# Patient Record
Sex: Female | Born: 1960
Health system: Southern US, Community
[De-identification: ages and names within clinical notes are randomized; demographics above are authoritative.]

## PROBLEM LIST (undated history)

## (undated) DIAGNOSIS — F410 Panic disorder [episodic paroxysmal anxiety] without agoraphobia: Secondary | ICD-10-CM

## (undated) DIAGNOSIS — M810 Age-related osteoporosis without current pathological fracture: Secondary | ICD-10-CM

## (undated) DIAGNOSIS — K219 Gastro-esophageal reflux disease without esophagitis: Secondary | ICD-10-CM

## (undated) DIAGNOSIS — C801 Malignant (primary) neoplasm, unspecified: Secondary | ICD-10-CM

## (undated) DIAGNOSIS — L259 Unspecified contact dermatitis, unspecified cause: Secondary | ICD-10-CM

## (undated) DIAGNOSIS — F4310 Post-traumatic stress disorder, unspecified: Secondary | ICD-10-CM

## (undated) DIAGNOSIS — F32A Depression, unspecified: Secondary | ICD-10-CM

## (undated) DIAGNOSIS — R7303 Prediabetes: Secondary | ICD-10-CM

## (undated) DIAGNOSIS — F329 Major depressive disorder, single episode, unspecified: Secondary | ICD-10-CM

## (undated) HISTORY — PX: TUBAL LIGATION: SHX77

## (undated) HISTORY — PX: OTHER SURGICAL HISTORY: SHX169

## (undated) HISTORY — DX: Gastro-esophageal reflux disease without esophagitis: K21.9

## (undated) HISTORY — PX: AUGMENTATION MAMMAPLASTY: SUR837

## (undated) HISTORY — PX: COLONOSCOPY: SHX174

## (undated) HISTORY — PX: ANKLE FRACTURE SURGERY: SHX122

## (undated) HISTORY — DX: Age-related osteoporosis without current pathological fracture: M81.0

## (undated) HISTORY — PX: BREAST SURGERY: SHX581

---

## 1994-08-30 HISTORY — PX: INCISION AND DRAINAGE OF WOUND: SHX1803

## 1999-01-21 ENCOUNTER — Encounter: Payer: Self-pay | Admitting: Emergency Medicine

## 1999-01-21 ENCOUNTER — Emergency Department (HOSPITAL_COMMUNITY): Admission: EM | Admit: 1999-01-21 | Discharge: 1999-01-21 | Payer: Self-pay | Admitting: Emergency Medicine

## 2006-08-30 DIAGNOSIS — C801 Malignant (primary) neoplasm, unspecified: Secondary | ICD-10-CM

## 2006-08-30 HISTORY — DX: Malignant (primary) neoplasm, unspecified: C80.1

## 2012-08-30 HISTORY — PX: OTHER SURGICAL HISTORY: SHX169

## 2015-04-17 DIAGNOSIS — S2242XA Multiple fractures of ribs, left side, initial encounter for closed fracture: Secondary | ICD-10-CM | POA: Diagnosis not present

## 2015-04-17 DIAGNOSIS — S2232XA Fracture of one rib, left side, initial encounter for closed fracture: Secondary | ICD-10-CM | POA: Diagnosis not present

## 2015-04-17 DIAGNOSIS — S2239XA Fracture of one rib, unspecified side, initial encounter for closed fracture: Secondary | ICD-10-CM | POA: Diagnosis not present

## 2015-04-17 DIAGNOSIS — F17200 Nicotine dependence, unspecified, uncomplicated: Secondary | ICD-10-CM | POA: Diagnosis not present

## 2015-04-20 DIAGNOSIS — T65894A Toxic effect of other specified substances, undetermined, initial encounter: Secondary | ICD-10-CM | POA: Diagnosis not present

## 2015-04-20 DIAGNOSIS — H10213 Acute toxic conjunctivitis, bilateral: Secondary | ICD-10-CM | POA: Diagnosis not present

## 2015-04-20 DIAGNOSIS — F419 Anxiety disorder, unspecified: Secondary | ICD-10-CM | POA: Diagnosis not present

## 2015-05-08 DIAGNOSIS — D225 Melanocytic nevi of trunk: Secondary | ICD-10-CM | POA: Diagnosis not present

## 2015-05-08 DIAGNOSIS — B079 Viral wart, unspecified: Secondary | ICD-10-CM | POA: Diagnosis not present

## 2015-05-08 DIAGNOSIS — Z7189 Other specified counseling: Secondary | ICD-10-CM | POA: Diagnosis not present

## 2015-05-08 DIAGNOSIS — C44511 Basal cell carcinoma of skin of breast: Secondary | ICD-10-CM | POA: Diagnosis not present

## 2015-05-08 DIAGNOSIS — L814 Other melanin hyperpigmentation: Secondary | ICD-10-CM | POA: Diagnosis not present

## 2015-05-08 DIAGNOSIS — D485 Neoplasm of uncertain behavior of skin: Secondary | ICD-10-CM | POA: Diagnosis not present

## 2015-05-09 DIAGNOSIS — Z1231 Encounter for screening mammogram for malignant neoplasm of breast: Secondary | ICD-10-CM | POA: Diagnosis not present

## 2015-05-09 DIAGNOSIS — F339 Major depressive disorder, recurrent, unspecified: Secondary | ICD-10-CM | POA: Diagnosis not present

## 2015-05-09 DIAGNOSIS — R42 Dizziness and giddiness: Secondary | ICD-10-CM | POA: Diagnosis not present

## 2015-05-09 DIAGNOSIS — M4722 Other spondylosis with radiculopathy, cervical region: Secondary | ICD-10-CM | POA: Diagnosis not present

## 2015-05-09 DIAGNOSIS — K219 Gastro-esophageal reflux disease without esophagitis: Secondary | ICD-10-CM | POA: Diagnosis not present

## 2015-05-09 DIAGNOSIS — Z13820 Encounter for screening for osteoporosis: Secondary | ICD-10-CM | POA: Diagnosis not present

## 2015-05-09 DIAGNOSIS — E780 Pure hypercholesterolemia: Secondary | ICD-10-CM | POA: Diagnosis not present

## 2015-05-09 DIAGNOSIS — F17210 Nicotine dependence, cigarettes, uncomplicated: Secondary | ICD-10-CM | POA: Diagnosis not present

## 2015-05-09 DIAGNOSIS — Z6825 Body mass index (BMI) 25.0-25.9, adult: Secondary | ICD-10-CM | POA: Diagnosis not present

## 2015-05-09 DIAGNOSIS — F411 Generalized anxiety disorder: Secondary | ICD-10-CM | POA: Diagnosis not present

## 2015-05-09 DIAGNOSIS — S2242XD Multiple fractures of ribs, left side, subsequent encounter for fracture with routine healing: Secondary | ICD-10-CM | POA: Diagnosis not present

## 2015-05-12 DIAGNOSIS — R350 Frequency of micturition: Secondary | ICD-10-CM | POA: Diagnosis not present

## 2015-05-12 DIAGNOSIS — K219 Gastro-esophageal reflux disease without esophagitis: Secondary | ICD-10-CM | POA: Diagnosis not present

## 2015-05-12 DIAGNOSIS — E780 Pure hypercholesterolemia: Secondary | ICD-10-CM | POA: Diagnosis not present

## 2015-05-12 DIAGNOSIS — E559 Vitamin D deficiency, unspecified: Secondary | ICD-10-CM | POA: Diagnosis not present

## 2015-05-13 DIAGNOSIS — M4722 Other spondylosis with radiculopathy, cervical region: Secondary | ICD-10-CM | POA: Diagnosis not present

## 2015-05-13 DIAGNOSIS — M542 Cervicalgia: Secondary | ICD-10-CM | POA: Diagnosis not present

## 2015-05-19 DIAGNOSIS — M8589 Other specified disorders of bone density and structure, multiple sites: Secondary | ICD-10-CM | POA: Diagnosis not present

## 2015-05-19 DIAGNOSIS — Z13820 Encounter for screening for osteoporosis: Secondary | ICD-10-CM | POA: Diagnosis not present

## 2015-05-26 DIAGNOSIS — C44511 Basal cell carcinoma of skin of breast: Secondary | ICD-10-CM | POA: Diagnosis not present

## 2015-08-19 DIAGNOSIS — M5489 Other dorsalgia: Secondary | ICD-10-CM | POA: Diagnosis not present

## 2015-08-19 DIAGNOSIS — Z6826 Body mass index (BMI) 26.0-26.9, adult: Secondary | ICD-10-CM | POA: Diagnosis not present

## 2015-08-19 DIAGNOSIS — G4700 Insomnia, unspecified: Secondary | ICD-10-CM | POA: Diagnosis not present

## 2015-08-19 DIAGNOSIS — K219 Gastro-esophageal reflux disease without esophagitis: Secondary | ICD-10-CM | POA: Diagnosis not present

## 2015-08-19 DIAGNOSIS — E559 Vitamin D deficiency, unspecified: Secondary | ICD-10-CM | POA: Diagnosis not present

## 2015-08-19 DIAGNOSIS — E7800 Pure hypercholesterolemia, unspecified: Secondary | ICD-10-CM | POA: Diagnosis not present

## 2015-08-19 DIAGNOSIS — F17210 Nicotine dependence, cigarettes, uncomplicated: Secondary | ICD-10-CM | POA: Diagnosis not present

## 2015-08-19 DIAGNOSIS — F411 Generalized anxiety disorder: Secondary | ICD-10-CM | POA: Diagnosis not present

## 2015-08-19 DIAGNOSIS — M25551 Pain in right hip: Secondary | ICD-10-CM | POA: Diagnosis not present

## 2015-08-19 DIAGNOSIS — F339 Major depressive disorder, recurrent, unspecified: Secondary | ICD-10-CM | POA: Diagnosis not present

## 2015-08-19 DIAGNOSIS — M859 Disorder of bone density and structure, unspecified: Secondary | ICD-10-CM | POA: Diagnosis not present

## 2015-11-19 DIAGNOSIS — R7301 Impaired fasting glucose: Secondary | ICD-10-CM | POA: Diagnosis not present

## 2015-11-19 DIAGNOSIS — F339 Major depressive disorder, recurrent, unspecified: Secondary | ICD-10-CM | POA: Diagnosis not present

## 2015-11-19 DIAGNOSIS — E559 Vitamin D deficiency, unspecified: Secondary | ICD-10-CM | POA: Diagnosis not present

## 2015-11-19 DIAGNOSIS — K219 Gastro-esophageal reflux disease without esophagitis: Secondary | ICD-10-CM | POA: Diagnosis not present

## 2015-11-19 DIAGNOSIS — Z0000 Encounter for general adult medical examination without abnormal findings: Secondary | ICD-10-CM | POA: Diagnosis not present

## 2015-11-19 DIAGNOSIS — F411 Generalized anxiety disorder: Secondary | ICD-10-CM | POA: Diagnosis not present

## 2015-11-19 DIAGNOSIS — G4700 Insomnia, unspecified: Secondary | ICD-10-CM | POA: Diagnosis not present

## 2015-11-19 DIAGNOSIS — F17210 Nicotine dependence, cigarettes, uncomplicated: Secondary | ICD-10-CM | POA: Diagnosis not present

## 2015-11-19 DIAGNOSIS — M859 Disorder of bone density and structure, unspecified: Secondary | ICD-10-CM | POA: Diagnosis not present

## 2016-04-20 DIAGNOSIS — F17200 Nicotine dependence, unspecified, uncomplicated: Secondary | ICD-10-CM | POA: Diagnosis not present

## 2016-04-20 DIAGNOSIS — M5116 Intervertebral disc disorders with radiculopathy, lumbar region: Secondary | ICD-10-CM | POA: Diagnosis not present

## 2016-04-20 DIAGNOSIS — M545 Low back pain: Secondary | ICD-10-CM | POA: Diagnosis not present

## 2016-04-20 DIAGNOSIS — Z6825 Body mass index (BMI) 25.0-25.9, adult: Secondary | ICD-10-CM | POA: Diagnosis not present

## 2016-04-20 DIAGNOSIS — Z79899 Other long term (current) drug therapy: Secondary | ICD-10-CM | POA: Diagnosis not present

## 2016-04-20 DIAGNOSIS — Z885 Allergy status to narcotic agent status: Secondary | ICD-10-CM | POA: Diagnosis not present

## 2016-04-20 DIAGNOSIS — Z880 Allergy status to penicillin: Secondary | ICD-10-CM | POA: Diagnosis not present

## 2016-04-20 DIAGNOSIS — M25551 Pain in right hip: Secondary | ICD-10-CM | POA: Diagnosis not present

## 2016-05-04 DIAGNOSIS — E559 Vitamin D deficiency, unspecified: Secondary | ICD-10-CM | POA: Diagnosis not present

## 2016-05-04 DIAGNOSIS — F17210 Nicotine dependence, cigarettes, uncomplicated: Secondary | ICD-10-CM | POA: Diagnosis not present

## 2016-05-04 DIAGNOSIS — R7301 Impaired fasting glucose: Secondary | ICD-10-CM | POA: Diagnosis not present

## 2016-05-04 DIAGNOSIS — Z6826 Body mass index (BMI) 26.0-26.9, adult: Secondary | ICD-10-CM | POA: Diagnosis not present

## 2016-05-04 DIAGNOSIS — E784 Other hyperlipidemia: Secondary | ICD-10-CM | POA: Diagnosis not present

## 2016-05-04 DIAGNOSIS — M5489 Other dorsalgia: Secondary | ICD-10-CM | POA: Diagnosis not present

## 2016-05-04 DIAGNOSIS — K219 Gastro-esophageal reflux disease without esophagitis: Secondary | ICD-10-CM | POA: Diagnosis not present

## 2016-05-04 DIAGNOSIS — F411 Generalized anxiety disorder: Secondary | ICD-10-CM | POA: Diagnosis not present

## 2016-05-04 DIAGNOSIS — M5136 Other intervertebral disc degeneration, lumbar region: Secondary | ICD-10-CM | POA: Diagnosis not present

## 2016-06-23 DIAGNOSIS — M5441 Lumbago with sciatica, right side: Secondary | ICD-10-CM | POA: Diagnosis not present

## 2016-07-14 DIAGNOSIS — K219 Gastro-esophageal reflux disease without esophagitis: Secondary | ICD-10-CM | POA: Diagnosis not present

## 2016-07-14 DIAGNOSIS — F419 Anxiety disorder, unspecified: Secondary | ICD-10-CM | POA: Diagnosis not present

## 2016-07-14 DIAGNOSIS — F4310 Post-traumatic stress disorder, unspecified: Secondary | ICD-10-CM | POA: Diagnosis not present

## 2016-08-31 ENCOUNTER — Encounter (HOSPITAL_COMMUNITY): Payer: Self-pay | Admitting: Emergency Medicine

## 2016-08-31 ENCOUNTER — Emergency Department (HOSPITAL_COMMUNITY)
Admission: EM | Admit: 2016-08-31 | Discharge: 2016-09-01 | Disposition: A | Discharge status: Other Acute Inpatient Hospital | Payer: Medicare Other | Attending: Emergency Medicine | Admitting: Emergency Medicine

## 2016-08-31 DIAGNOSIS — Z79899 Other long term (current) drug therapy: Secondary | ICD-10-CM | POA: Insufficient documentation

## 2016-08-31 DIAGNOSIS — Z853 Personal history of malignant neoplasm of breast: Secondary | ICD-10-CM | POA: Diagnosis not present

## 2016-08-31 DIAGNOSIS — T424X2A Poisoning by benzodiazepines, intentional self-harm, initial encounter: Secondary | ICD-10-CM | POA: Insufficient documentation

## 2016-08-31 DIAGNOSIS — F17210 Nicotine dependence, cigarettes, uncomplicated: Secondary | ICD-10-CM | POA: Insufficient documentation

## 2016-08-31 DIAGNOSIS — F332 Major depressive disorder, recurrent severe without psychotic features: Secondary | ICD-10-CM | POA: Insufficient documentation

## 2016-08-31 DIAGNOSIS — T50904A Poisoning by unspecified drugs, medicaments and biological substances, undetermined, initial encounter: Secondary | ICD-10-CM | POA: Diagnosis not present

## 2016-08-31 HISTORY — DX: Malignant (primary) neoplasm, unspecified: C80.1

## 2016-08-31 LAB — COMPREHENSIVE METABOLIC PANEL
ALBUMIN: 4.6 g/dL (ref 3.5–5.0)
ALT: 19 U/L (ref 14–54)
AST: 23 U/L (ref 15–41)
Alkaline Phosphatase: 63 U/L (ref 38–126)
Anion gap: 8 (ref 5–15)
BUN: 11 mg/dL (ref 6–20)
CHLORIDE: 108 mmol/L (ref 101–111)
CO2: 23 mmol/L (ref 22–32)
CREATININE: 1.06 mg/dL — AB (ref 0.44–1.00)
Calcium: 9.4 mg/dL (ref 8.9–10.3)
GFR calc Af Amer: >60 mL/min (ref >60)
GFR calc non Af Amer: 58 mL/min — ABNORMAL LOW (ref >60)
Glucose, Bld: 93 mg/dL (ref 65–99)
Potassium: 3.7 mmol/L (ref 3.5–5.1)
SODIUM: 139 mmol/L (ref 135–145)
Total Bilirubin: 0.9 mg/dL (ref 0.3–1.2)
Total Protein: 7.2 g/dL (ref 6.5–8.1)

## 2016-08-31 LAB — CBC
HCT: 44.7 % (ref 36.0–46.0)
HEMOGLOBIN: 15 g/dL (ref 12.0–15.0)
MCH: 32.3 pg (ref 26.0–34.0)
MCHC: 33.6 g/dL (ref 30.0–36.0)
MCV: 96.3 fL (ref 78.0–100.0)
Platelets: 166 10*3/uL (ref 150–400)
RBC: 4.64 MIL/uL (ref 3.87–5.11)
RDW: 12.9 % (ref 11.5–15.5)
WBC: 4.6 10*3/uL (ref 4.0–10.5)

## 2016-08-31 LAB — ETHANOL: Alcohol, Ethyl (B): 129 mg/dL — ABNORMAL HIGH (ref <5)

## 2016-08-31 LAB — RAPID URINE DRUG SCREEN, HOSP PERFORMED
AMPHETAMINES: NOT DETECTED
BENZODIAZEPINES: POSITIVE — AB
Barbiturates: NOT DETECTED
Cocaine: NOT DETECTED
OPIATES: NOT DETECTED
TETRAHYDROCANNABINOL: NOT DETECTED

## 2016-08-31 LAB — ACETAMINOPHEN LEVEL: Acetaminophen (Tylenol), Serum: <10 ug/mL — ABNORMAL LOW (ref 10–30)

## 2016-08-31 LAB — CBG MONITORING, ED: GLUCOSE-CAPILLARY: 91 mg/dL (ref 65–99)

## 2016-08-31 LAB — SALICYLATE LEVEL: Salicylate Lvl: <7 mg/dL (ref 2.8–30.0)

## 2016-08-31 NOTE — ED Notes (Signed)
Spoke with Blanch Media at [employer]: -CNS and respiratory depression -Supportive care -Observe until midnight and then can be cleared

## 2016-08-31 NOTE — ED Notes (Signed)
Bed: RESA Expected date:  Expected time:  Means of arrival:  Comments: EMS 56 yo female overdosed on 32 xanax and intoxicated
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
## 2016-08-31 NOTE — ED Triage Notes (Signed)
Patient arrives by EMS with complaints of overdose. Per EMS, patient states she is depressed and took 50 of Xanax 0.5 mg at 1900 with 4 beers. CBG 124, #20 angio left hand. VSS 117/78 HR 84 RR18 and 97%RA

## 2016-09-01 ENCOUNTER — Inpatient Hospital Stay (HOSPITAL_COMMUNITY)
Admission: AD | Admit: 2016-09-01 | Discharge: 2016-09-04 | DRG: 885 | Disposition: A | Discharge status: Home / Self Care | Payer: Medicare Other | Source: Intra-hospital | Attending: Psychiatry | Admitting: Psychiatry

## 2016-09-01 ENCOUNTER — Encounter (HOSPITAL_COMMUNITY): Payer: Self-pay | Admitting: *Deleted

## 2016-09-01 DIAGNOSIS — Z789 Other specified health status: Secondary | ICD-10-CM | POA: Diagnosis not present

## 2016-09-01 DIAGNOSIS — Z803 Family history of malignant neoplasm of breast: Secondary | ICD-10-CM | POA: Diagnosis not present

## 2016-09-01 DIAGNOSIS — Z85038 Personal history of other malignant neoplasm of large intestine: Secondary | ICD-10-CM

## 2016-09-01 DIAGNOSIS — F411 Generalized anxiety disorder: Secondary | ICD-10-CM | POA: Diagnosis present

## 2016-09-01 DIAGNOSIS — F332 Major depressive disorder, recurrent severe without psychotic features: Secondary | ICD-10-CM | POA: Diagnosis not present

## 2016-09-01 DIAGNOSIS — G4700 Insomnia, unspecified: Secondary | ICD-10-CM | POA: Diagnosis present

## 2016-09-01 DIAGNOSIS — R45851 Suicidal ideations: Secondary | ICD-10-CM | POA: Diagnosis present

## 2016-09-01 DIAGNOSIS — Z853 Personal history of malignant neoplasm of breast: Secondary | ICD-10-CM

## 2016-09-01 DIAGNOSIS — T424X1A Poisoning by benzodiazepines, accidental (unintentional), initial encounter: Secondary | ICD-10-CM | POA: Diagnosis present

## 2016-09-01 DIAGNOSIS — F410 Panic disorder [episodic paroxysmal anxiety] without agoraphobia: Secondary | ICD-10-CM | POA: Diagnosis present

## 2016-09-01 DIAGNOSIS — F17210 Nicotine dependence, cigarettes, uncomplicated: Secondary | ICD-10-CM | POA: Diagnosis present

## 2016-09-01 DIAGNOSIS — T424X2A Poisoning by benzodiazepines, intentional self-harm, initial encounter: Secondary | ICD-10-CM | POA: Diagnosis not present

## 2016-09-01 DIAGNOSIS — F1090 Alcohol use, unspecified, uncomplicated: Secondary | ICD-10-CM

## 2016-09-01 DIAGNOSIS — F4310 Post-traumatic stress disorder, unspecified: Secondary | ICD-10-CM | POA: Diagnosis present

## 2016-09-01 DIAGNOSIS — Z7289 Other problems related to lifestyle: Secondary | ICD-10-CM

## 2016-09-01 DIAGNOSIS — F322 Major depressive disorder, single episode, severe without psychotic features: Secondary | ICD-10-CM | POA: Diagnosis not present

## 2016-09-01 DIAGNOSIS — T424X4A Poisoning by benzodiazepines, undetermined, initial encounter: Secondary | ICD-10-CM | POA: Diagnosis not present

## 2016-09-01 DIAGNOSIS — Z62810 Personal history of physical and sexual abuse in childhood: Secondary | ICD-10-CM | POA: Diagnosis present

## 2016-09-01 DIAGNOSIS — Z800 Family history of malignant neoplasm of digestive organs: Secondary | ICD-10-CM | POA: Diagnosis not present

## 2016-09-01 DIAGNOSIS — Z79899 Other long term (current) drug therapy: Secondary | ICD-10-CM | POA: Diagnosis not present

## 2016-09-01 MED ORDER — MAGNESIUM HYDROXIDE 400 MG/5ML PO SUSP: 30.0000 mL | Freq: Every day | ORAL | Status: DC | PRN

## 2016-09-01 MED ORDER — HYDROXYZINE HCL 25 MG PO TABS
25.0000 mg | ORAL_TABLET | Freq: Three times a day (TID) | ORAL | Status: DC | PRN
  Filled 2016-09-01: qty 10

## 2016-09-01 MED ORDER — PANTOPRAZOLE SODIUM 40 MG PO TBEC
40.0000 mg | DELAYED_RELEASE_TABLET | Freq: Every day | ORAL | Status: DC
  Administered 2016-09-02 – 2016-09-04 (×3): 40 mg via ORAL
  Filled 2016-09-01 (×5): qty 1

## 2016-09-01 MED ORDER — HYDROXYZINE HCL 25 MG PO TABS
25.0000 mg | ORAL_TABLET | Freq: Three times a day (TID) | ORAL | Status: DC | PRN
Start: 2016-09-01 — End: 2016-09-01

## 2016-09-01 MED ORDER — FLUOXETINE HCL 10 MG PO CAPS
10.0000 mg | ORAL_CAPSULE | Freq: Every day | ORAL | Status: DC
Start: 2016-09-01 — End: 2016-09-01
  Filled 2016-09-01: qty 1

## 2016-09-01 MED ORDER — FLUOXETINE HCL 10 MG PO CAPS
10.0000 mg | ORAL_CAPSULE | Freq: Every day | ORAL | Status: DC
Start: 2016-09-02 — End: 2016-09-02
  Filled 2016-09-01 (×2): qty 1

## 2016-09-01 MED ORDER — NICOTINE 21 MG/24HR TD PT24
21.0000 mg | MEDICATED_PATCH | Freq: Every day | TRANSDERMAL | Status: DC
  Administered 2016-09-01 – 2016-09-04 (×4): 21 mg via TRANSDERMAL
  Filled 2016-09-01 (×6): qty 1

## 2016-09-01 MED ORDER — OMEPRAZOLE MAGNESIUM 20 MG PO TBEC
20.0000 mg | DELAYED_RELEASE_TABLET | Freq: Every day | ORAL | Status: DC
  Filled 2016-09-01: qty 1

## 2016-09-01 MED ORDER — TRAZODONE HCL 100 MG PO TABS: 100.0000 mg | ORAL_TABLET | Freq: Every evening | ORAL | Status: DC | PRN

## 2016-09-01 MED ORDER — OMEPRAZOLE 20 MG PO CPDR
20.0000 mg | DELAYED_RELEASE_CAPSULE | Freq: Every day | ORAL | Status: DC
  Administered 2016-09-01: 20 mg via ORAL
  Filled 2016-09-01: qty 1

## 2016-09-01 MED ORDER — ALUM & MAG HYDROXIDE-SIMETH 200-200-20 MG/5ML PO SUSP
30.0000 mL | ORAL | Status: DC | PRN
  Administered 2016-09-02: 30 mL via ORAL
  Filled 2016-09-01: qty 30

## 2016-09-01 NOTE — ED Notes (Signed)
Blanch Media from [place] control called for update on pt. Pt sleeping at this time.

## 2016-09-01 NOTE — ED Notes (Signed)
Patient placed in scrubs-patient and belongings wanded prior to transfer to [facility]

## 2016-09-01 NOTE — Progress Notes (Addendum)
Adult Psychoeducational Group Note  Date:  09/01/2016 Time:  11:50 PM  Group Topic/Focus:  Wrap-Up Group:   The focus of this group is to help patients review their daily goal of treatment and discuss progress on daily workbooks.   Participation Level:  Active  Participation Quality:  Appropriate  Affect:  Appropriate  Cognitive:  Appropriate  Insight: Appropriate  Engagement in Group:  Engaged  Modes of Intervention:  Discussion  Additional Comments: Patient attended wrap-up group and said that her day started up with a 2 but later changed to a 10.  She contributed that to her peers who had her feel welcome.   Orma Cheetham W Masiyah Jorstad [id number], 11:50 PM
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
## 2016-09-01 NOTE — ED Notes (Signed)
Patient states she is feeling weak and unsteady because she hasn't eaten. Patient offered a variety of food, everything available in department. Pt refuses food, states she can't eat now. Only thing she will accept is juice.

## 2016-09-01 NOTE — Progress Notes (Signed)
09/01/16 1403:  LRT went to pt room to offer activities, pt was sleep.  Victorino Sparrow, LRT/CTRS

## 2016-09-01 NOTE — ED Notes (Signed)
[facility] transport on unit to transfer pt to [facility] Adult unit per MD order. Pt signed for personal property/medication and property given to [facility] transport for transfer. Pt signed e-signature, Pt ambulatory off unit with [facility] transport.
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
## 2016-09-01 NOTE — Progress Notes (Signed)
Patient ID: Kayla Chang, female   DOB: 10-10-1960, 56 y.o.   MRN: [id number] PER STATE REGULATIONS 482.30  THIS CHART WAS REVIEWED FOR MEDICAL NECESSITY WITH RESPECT TO THE PATIENT'S ADMISSION/DURATION OF STAY.  NEXT REVIEW DATE:09/05/16  Roma Schanz, RN, BSN CASE MANAGER

## 2016-09-01 NOTE — Tx Team (Signed)
Initial Treatment Plan 09/01/2016 3:59 PM Kayla Chang [id number]    PATIENT STRESSORS: Financial difficulties Marital or family conflict   PATIENT STRENGTHS: Ability for insight Communication skills Motivation for treatment/growth Physical Health   PATIENT IDENTIFIED PROBLEMS: At risk for suicide  Depression  Anxiety  "Self-Esteem"  "Self- sufficient"             DISCHARGE CRITERIA:  Ability to meet basic life and health needs Improved stabilization in mood, thinking, and/or behavior Medical problems require only outpatient monitoring Need for constant or close observation no longer present Safe-care adequate arrangements made  PRELIMINARY DISCHARGE PLAN: Outpatient therapy Return to previous living arrangement  PATIENT/FAMILY INVOLVEMENT: This treatment plan has been presented to and reviewed with the patient, Kayla Chang.  The patient and family have been given the opportunity to ask questions and make suggestions.  Dustin Flock, RN 09/01/2016, 3:59 PM

## 2016-09-01 NOTE — ED Notes (Signed)
Pt visitor at bedside.

## 2016-09-01 NOTE — BH Assessment (Signed)
[place] Assessment Progress Note   Clinician spoke with Terri, Therapist, sports.  Patient is sleeping currently but will try assessment in about 10 min.

## 2016-09-01 NOTE — BH Assessment (Signed)
[place] Assessment Progress Note  Per Corena Pilgrim, MD, this pt requires psychiatric hospitalization at this time.  Letitia Libra, RN, [facility] has assigned pt to [facility] Rm [id number]; they will be ready to receive pt at 14:00.  Pt has signed Voluntary Admission and Consent for Treatment, as well as Consent to Release Information to her son and her daughter-in-law, and signed forms have been faxed to [facility]  Pt's nurse, Caryl Pina, has been notified, and agrees to send original paperwork along with pt via [facility], and to call report to [phone].  Jalene Mullet, [place] Triage Specialist [phone]

## 2016-09-01 NOTE — ED Notes (Signed)
TTS still in progress

## 2016-09-02 DIAGNOSIS — Z803 Family history of malignant neoplasm of breast: Secondary | ICD-10-CM

## 2016-09-02 DIAGNOSIS — T424X4A Poisoning by benzodiazepines, undetermined, initial encounter: Secondary | ICD-10-CM

## 2016-09-02 DIAGNOSIS — F332 Major depressive disorder, recurrent severe without psychotic features: Principal | ICD-10-CM

## 2016-09-02 DIAGNOSIS — T424X1A Poisoning by benzodiazepines, accidental (unintentional), initial encounter: Secondary | ICD-10-CM | POA: Diagnosis present

## 2016-09-02 DIAGNOSIS — Z800 Family history of malignant neoplasm of digestive organs: Secondary | ICD-10-CM

## 2016-09-02 DIAGNOSIS — Z888 Allergy status to other drugs, medicaments and biological substances status: Secondary | ICD-10-CM

## 2016-09-02 MED ORDER — IBUPROFEN 600 MG PO TABS
600.0000 mg | ORAL_TABLET | Freq: Four times a day (QID) | ORAL | Status: DC | PRN
Start: 2016-09-02 — End: 2016-09-04
  Administered 2016-09-02: 600 mg via ORAL
  Filled 2016-09-02: qty 1

## 2016-09-02 MED ORDER — TRAZODONE HCL 50 MG PO TABS
50.0000 mg | ORAL_TABLET | Freq: Every evening | ORAL | Status: DC | PRN
  Filled 2016-09-02: qty 10

## 2016-09-02 MED ORDER — LORAZEPAM 1 MG PO TABS: 1.0000 mg | ORAL_TABLET | Freq: Four times a day (QID) | ORAL | Status: DC | PRN

## 2016-09-02 MED ORDER — BUSPIRONE HCL 5 MG PO TABS
5.0000 mg | ORAL_TABLET | Freq: Two times a day (BID) | ORAL | Status: DC
Start: 2016-09-02 — End: 2016-09-04
  Administered 2016-09-02 – 2016-09-04 (×4): 5 mg via ORAL
  Filled 2016-09-02 (×9): qty 1

## 2016-09-02 NOTE — BHH Group Notes (Signed)
[facility] Group Therapy 09/02/2016 1:15pm  Type of Therapy: Mental Health Association Presentation  Participation Level: Active  Participation Quality: Attentive  Affect: Appropriate  Cognitive: Oriented  Insight: Developing/Improving  Engagement in Therapy: Engaged  Modes of Intervention: Discussion, Education and Socialization  Summary of Progress/Problems: Mental Health Association ([place]) Speaker came to talk about his personal journey with substance abuse and addiction. The pt processed ways by which to relate to the speaker. [place] speaker provided handouts and educational information pertaining to groups and services offered by the [facility]. Pt was engaged in speaker's presentation and was receptive to resources provided.    Adriana Reams, LCSW 09/02/2016 1:12 PM

## 2016-09-02 NOTE — BHH Suicide Risk Assessment (Signed)
[place] INPATIENT:  Family/Significant Other Suicide Prevention Education  Suicide Prevention Education:  Education Completed; Sharmaine Base, Pt's son [phone], has been identified by the patient as the family member/significant other with whom the patient will be residing, and identified as the person(s) who will aid the patient in the event of a mental health crisis (suicidal ideations/suicide attempt).  With written consent from the patient, the family member/significant other has been provided the following suicide prevention education, prior to the and/or following the discharge of the patient.  The suicide prevention education provided includes the following:  Suicide risk factors  Suicide prevention and interventions  National Suicide Hotline telephone number  [facility] assessment telephone number  [facility] Emergency Assistance [place] and/or Residential Mobile Crisis Unit telephone number  Request made of family/significant other to:  Remove weapons (e.g., guns, rifles, knives), all items previously/currently identified as safety concern.    Remove drugs/medications (over-the-counter, prescriptions, illicit drugs), all items previously/currently identified as a safety concern.  The family member/significant other verbalizes understanding of the suicide prevention education information provided.  The family member/significant other agrees to remove the items of safety concern listed above.  Gladstone Lighter 09/02/2016, 12:48 PM
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
## 2016-09-02 NOTE — Progress Notes (Signed)
DAR NOTE: Patient presents with anxious affect and depressed mood.  Denies pain, auditory and visual hallucinations.  Rates depression at 0, hopelessness at 0, and anxiety at 1.  Maintained on routine safety checks.  Medications given as prescribed.  Support and encouragement offered as needed.  Attended group and participated.  States goal for today is " keep a clear mind."  Patient observed socializing with peers in the dayroom.  Offered no complaint.
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
## 2016-09-02 NOTE — BHH Group Notes (Signed)
[facility] Group Notes:  (Nursing/MHT/Case Management/Adjunct)  Date:  09/02/2016  Time:  12:44 PM  Type of Therapy:  Nurse Education  Participation Level:  Minimal  Participation Quality:  Appropriate  Affect:  Appropriate  Cognitive:  Alert  Insight:  Improving  Engagement in Group:  Improving  Modes of Intervention:  Discussion, Education and Support  Summary of Progress/Problems: Offered little but did respond appropriately when asked directly  Davina Poke 09/02/2016, 12:44 PM
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
## 2016-09-02 NOTE — Progress Notes (Signed)
D: Pt was in the hallway upon initial approach.  Pt presents with anxious, depressed affect and mood.  Pt just arrived today and reports she is still adjusting to the milieu.  She reports having a good visit with her son.  Pt denies SI/HI, denies hallucinations, denies pain.  Pt has been visible in milieu interacting with peers and staff appropriately.  Pt attended evening group.   A: Introduced self to pt.  Actively listened to pt and offered support and encouragement.  R: Pt is safe on the unit.  She reports she will inform staff of needs and concerns.  Pt verbally contracts for safety.  Will continue to monitor and assess.

## 2016-09-03 DIAGNOSIS — F17210 Nicotine dependence, cigarettes, uncomplicated: Secondary | ICD-10-CM

## 2016-09-03 DIAGNOSIS — T424X1A Poisoning by benzodiazepines, accidental (unintentional), initial encounter: Secondary | ICD-10-CM

## 2016-09-03 DIAGNOSIS — Z79899 Other long term (current) drug therapy: Secondary | ICD-10-CM

## 2016-09-03 NOTE — Progress Notes (Signed)
Recreation Therapy Notes  Date: 09/03/16 Time: 0930 Location: [address] Dayroom  Group Topic: Stress Management  Goal Area(s) Addresses:  Patient will verbalize importance of using healthy stress management.  Patient will identify positive emotions associated with healthy stress management.   Intervention:  Stress Management  Activity :  [facility].  LRT introduced the stress management technique of guided imagery to patients.  LRT read a script to guide patients through the technique so they could engage in the process.  Patients were to follow along as LRT read script.  Education:  Stress Management, Discharge Planning.   Education Outcome: Acknowledges edcuation/In group clarification offered/Needs additional education  Clinical Observations/Feedback:  Pt did not attend group.     Victorino Sparrow, LRT/CTRS         Victorino Sparrow A 09/03/2016 11:25 AM

## 2016-09-03 NOTE — [facility] Group Notes (Signed)
[place] LCSW Group Therapy 09/03/2016 1:15pm  Type of Therapy: Group Therapy- Feelings Around Relapse and Recovery  Participation Level: Active   Participation Quality:  Appropriate  Affect:  Appropriate  Cognitive: Alert and Oriented   Insight:  Developing   Engagement in Therapy: Developing/Improving and Engaged   Modes of Intervention: Clarification, Confrontation, Discussion, Education, Exploration, Limit-setting, Orientation, Problem-solving, Rapport Building, Art therapist, Socialization and Support  Summary of Progress/Problems: The topic for today was feelings about relapse. The group discussed what relapse prevention is to them and identified triggers that they are on the path to relapse. Members also processed their feeling towards relapse and were able to relate to common experiences. Group also discussed coping skills that can be used for relapse prevention.  Pt was active in group discussion. She interacted well with peers and provided feedback as well. Pt reports that she feels like a burden as her son is having to support her financially and emotionally. Pt expresses that this often makes her feel worthless because she is "the mother" and he is "the son."   Therapeutic Modalities:   Cognitive Behavioral Therapy Solution-Focused Therapy Assertiveness Training Relapse Prevention Therapy    Gaspar Cola [phone] 09/03/2016 4:05 PM

## 2016-09-03 NOTE — Progress Notes (Signed)
D: Patient seen on day room watching TV. No interaction. Denies pain, SI/HI, AH/VH at this time. Complained of heart burn - accepted Maalox with good effect. Rated 0/10 to depression and anxiety. No further compliant.   A:Staff offered support and encouragement as needed. Routine safety checks maintained for safety. Will continue to monitor patient.  R: Patient remains safe.

## 2016-09-03 NOTE — Progress Notes (Signed)
[facility] MD Progress Note  09/03/2016 3:52 PM Kayla Chang  MRN:  [id number] Subjective:  Patient states that she is hoping that Buspar will work. Objective:  Patient states that she took one too may of her Xanax to fall asleep.  She states that she had been under a lot of stress but does feel hopeful that she will attend outpatient rehab for peer support.  She is hopeful for discharge this weekend.  Not isolating.  Attending groups.  She is compliant on meds and there is no reports of ADR's.  Principal Problem: Overdose of benzodiazepine Diagnosis:   Patient Active Problem List   Diagnosis Date Noted  . Overdose of benzodiazepine [T42.4X1A] 09/02/2016  . Major depressive disorder, recurrent episode, severe ([place]) [F33.2] 09/01/2016  . Major depressive disorder, single episode, severe without psychotic features ([place]) [F32.2] 09/01/2016   Total Time spent with patient: 30 minutes  Past Psychiatric History: see HPI  Past Medical History:  Past Medical History:  Diagnosis Date  . Cancer [facility]) 2008   breast cancer, colon cancer    Past Surgical History:  Procedure Laterality Date  . BREAST SURGERY     Family History: History reviewed. No pertinent family history. Family Psychiatric  History: see HPI Social History:  History  Alcohol Use  . 2.4 oz/week  . 4 Cans of beer per week     History  Drug Use No    Social History   Social History  . Marital status: Legally Separated    Spouse name: N/A  . Number of children: N/A  . Years of education: N/A   Social History Main Topics  . Smoking status: Current Every Day Smoker    Packs/day: 1.00    Types: Cigarettes  . Smokeless tobacco: Never Used  . Alcohol use 2.4 oz/week    4 Cans of beer per week  . Drug use: No  . Sexual activity: Not Asked   Other Topics Concern  . None   Social History Narrative  . None   Additional Social History:                         Sleep: Good  Appetite:  Good  Current  Medications: Current Facility-Administered Medications  Medication Dose Route Frequency Provider Last Rate Last Dose  . alum & mag hydroxide-simeth (MAALOX/MYLANTA) 200-200-20 MG/5ML suspension 30 mL  30 mL Oral Q4H PRN Patrecia Pour, NP   30 mL at 09/02/16 2145  . busPIRone (BUSPAR) tablet 5 mg  5 mg Oral BID Jenne Campus, MD   5 mg at 09/03/16 0744  . hydrOXYzine (ATARAX/VISTARIL) tablet 25 mg  25 mg Oral TID PRN Patrecia Pour, NP      . ibuprofen (ADVIL,MOTRIN) tablet 600 mg  600 mg Oral Q6H PRN Benjamine Mola, FNP   600 mg at 09/02/16 1715  . LORazepam (ATIVAN) tablet 1 mg  1 mg Oral Q6H PRN Myer Peer Dareen Gutzwiller, MD      . magnesium hydroxide (MILK OF MAGNESIA) suspension 30 mL  30 mL Oral Daily PRN Patrecia Pour, NP      . nicotine (NICODERM CQ - dosed in mg/24 hours) patch 21 mg  21 mg Transdermal Daily Kerrie Buffalo, NP   21 mg at 09/03/16 0746  . pantoprazole (PROTONIX) EC tablet 40 mg  40 mg Oral Daily Patrecia Pour, NP   40 mg at 09/03/16 0744  . traZODone (DESYREL) tablet 50 mg  50 mg Oral QHS PRN Jenne Campus, MD        Lab Results: No results found for this or any previous visit (from the past 48 hour(s)).  Blood Alcohol level:  Lab Results  Component Value Date   ETH 129 (H) 99991111    Metabolic Disorder Labs: No results found for: HGBA1C, MPG No results found for: PROLACTIN No results found for: CHOL, TRIG, HDL, CHOLHDL, VLDL, LDLCALC  Physical Findings: AIMS: Facial and Oral Movements Muscles of Facial Expression: None, normal Lips and Perioral Area: None, normal Jaw: None, normal Tongue: None, normal,Extremity Movements Upper (arms, wrists, hands, fingers): None, normal Lower (legs, knees, ankles, toes): None, normal, Trunk Movements Neck, shoulders, hips: None, normal, Overall Severity Severity of abnormal movements (highest score from questions above): None, normal Incapacitation due to abnormal movements: None, normal Patient's awareness of  abnormal movements (rate only patient's report): No Awareness, Dental Status Current problems with teeth and/or dentures?: No Does patient usually wear dentures?: No  CIWA:    COWS:     Musculoskeletal: Strength & Muscle Tone: within normal limits Gait & Station: normal Patient leans: N/A  Psychiatric Specialty Exam: Physical Exam  Nursing note and vitals reviewed.   ROS  Blood pressure 117/86, pulse 81, temperature 98 F (36.7 C), temperature source Oral, resp. rate 18, height 5' 6.5" (1.689 m), weight 74.8 kg (165 lb).Body mass index is 26.23 kg/m.  General Appearance: Casual  Eye Contact:  Good  Speech:  Clear and Coherent  Volume:  Normal  Mood:  Anxious  Affect:  Appropriate  Thought Process:  Coherent  Orientation:  Full (Time, Place, and Person)  Thought Content:  Rumination  Suicidal Thoughts:  No  Homicidal Thoughts:  No  Memory:  Immediate;   Fair Recent;   Fair Remote;   Fair  Judgement:  Fair  Insight:  Fair  Psychomotor Activity:  Normal  Concentration:  Concentration: Good and Attention Span: Good  Recall:  Good  Fund of Knowledge:  Good  Language:  Good  Akathisia:  No  Handed:  Right  AIMS (if indicated):     Assets:  Desire for Improvement Resilience  ADL's:  Intact  Cognition:  WNL  Sleep:  Number of Hours: 5.75     Treatment Plan Summary: Review of chart, vital signs, medications, and notes.  1-Individual and group therapy  2-Medication management for depression and anxiety: Medications reviewed with the patient and she stated no untoward effects, unchanged.  3-Coping skills for depression, anxiety  4-Continue crisis stabilization and management  5-Address health issues--monitoring vital signs, stable  6-Treatment plan in progress to prevent relapse of depression and anxiety   Janett Labella, NP [facility] 09/03/2016, 3:52 PM   Agree with NP Progress Note

## 2016-09-03 NOTE — Progress Notes (Signed)
Adult Psychoeducational Group Note  Date:  09/03/2016 Time:  10:11 PM  Group Topic/Focus:  Wrap-Up Group:   The focus of this group is to help patients review their daily goal of treatment and discuss progress on daily workbooks.   Participation Level:  Active  Participation Quality:  Appropriate  Affect:  Appropriate  Cognitive:  Alert, Appropriate and Oriented  Insight: Appropriate  Engagement in Group:  Engaged  Modes of Intervention:  Discussion  Additional Comments:  Patient attended wrap up group and said that her day was a 8.5.  Patient was excited because she was notified that she will be discharged tomorrow.  [name] [name] [id number], 10:11 PM

## 2016-09-03 NOTE — Tx Team (Signed)
Interdisciplinary Treatment and Diagnostic Plan Update  09/03/2016 Time of Session: 8:41 AM  Kayla Chang MRN: [id number]  Principal Diagnosis: Overdose of benzodiazepine  Secondary Diagnoses: Principal Problem:   Overdose of benzodiazepine Active Problems:   Major depressive disorder, single episode, severe without psychotic features ([place])   Current Medications:  Current Facility-Administered Medications  Medication Dose Route Frequency Provider Last Rate Last Dose  . alum & mag hydroxide-simeth (MAALOX/MYLANTA) 200-200-20 MG/5ML suspension 30 mL  30 mL Oral Q4H PRN Patrecia Pour, NP   30 mL at 09/02/16 2145  . busPIRone (BUSPAR) tablet 5 mg  5 mg Oral BID Jenne Campus, MD   5 mg at 09/03/16 0744  . hydrOXYzine (ATARAX/VISTARIL) tablet 25 mg  25 mg Oral TID PRN Patrecia Pour, NP      . ibuprofen (ADVIL,MOTRIN) tablet 600 mg  600 mg Oral Q6H PRN Benjamine Mola, FNP   600 mg at 09/02/16 1715  . LORazepam (ATIVAN) tablet 1 mg  1 mg Oral Q6H PRN Myer Peer Cobos, MD      . magnesium hydroxide (MILK OF MAGNESIA) suspension 30 mL  30 mL Oral Daily PRN Patrecia Pour, NP      . nicotine (NICODERM CQ - dosed in mg/24 hours) patch 21 mg  21 mg Transdermal Daily Kerrie Buffalo, NP   21 mg at 09/03/16 0746  . pantoprazole (PROTONIX) EC tablet 40 mg  40 mg Oral Daily Patrecia Pour, NP   40 mg at 09/03/16 0744  . traZODone (DESYREL) tablet 50 mg  50 mg Oral QHS PRN Jenne Campus, MD        PTA Medications: Prescriptions Prior to Admission  Medication Sig Dispense Refill Last Dose  . omeprazole (PRILOSEC OTC) 20 MG tablet Take 20 mg by mouth daily.   08/31/2016 at Unknown time    Treatment Modalities: Medication Management, Group therapy, Case management,  1 to 1 session with clinician, Psychoeducation, Recreational therapy.  Patient Stressors: Financial difficulties Marital or family conflict  Patient Strengths: Ability for Estate manager/land agent for  treatment/growth Physical Health  Physician Treatment Plan for Primary Diagnosis: Overdose of benzodiazepine Long Term Goal(s): Improvement in symptoms so as ready for discharge  Short Term Goals: Ability to disclose and discuss suicidal ideas Ability to demonstrate self-control will improve Ability to identify and develop effective coping behaviors will improve Ability to maintain clinical measurements within normal limits will improve Ability to verbalize feelings will improve Ability to disclose and discuss suicidal ideas Ability to demonstrate self-control will improve Ability to identify and develop effective coping behaviors will improve Ability to maintain clinical measurements within normal limits will improve  Medication Management: Evaluate patient's response, side effects, and tolerance of medication regimen.  Therapeutic Interventions: 1 to 1 sessions, Unit Group sessions and Medication administration.  Evaluation of Outcomes: Not Met  Physician Treatment Plan for Secondary Diagnosis: Principal Problem:   Overdose of benzodiazepine Active Problems:   Major depressive disorder, single episode, severe without psychotic features ([place])   Long Term Goal(s): Improvement in symptoms so as ready for discharge  Short Term Goals: Ability to disclose and discuss suicidal ideas Ability to demonstrate self-control will improve Ability to identify and develop effective coping behaviors will improve Ability to maintain clinical measurements within normal limits will improve Ability to verbalize feelings will improve Ability to disclose and discuss suicidal ideas Ability to demonstrate self-control will improve Ability to identify and develop effective coping behaviors will improve Ability to  maintain clinical measurements within normal limits will improve  Medication Management: Evaluate patient's response, side effects, and tolerance of medication regimen.  Therapeutic  Interventions: 1 to 1 sessions, Unit Group sessions and Medication administration.  Evaluation of Outcomes: Not Met   RN Treatment Plan for Primary Diagnosis: Overdose of benzodiazepine Long Term Goal(s): Knowledge of disease and therapeutic regimen to maintain health will improve  Short Term Goals: Ability to verbalize feelings will improve, Ability to disclose and discuss suicidal ideas and Ability to identify and develop effective coping behaviors will improve  Medication Management: RN will administer medications as ordered by provider, will assess and evaluate patient's response and provide education to patient for prescribed medication. RN will report any adverse and/or side effects to prescribing provider.  Therapeutic Interventions: 1 on 1 counseling sessions, Psychoeducation, Medication administration, Evaluate responses to treatment, Monitor vital signs and CBGs as ordered, Perform/monitor CIWA, COWS, AIMS and Fall Risk screenings as ordered, Perform wound care treatments as ordered.  Evaluation of Outcomes: Not Met   LCSW Treatment Plan for Primary Diagnosis: Overdose of benzodiazepine Long Term Goal(s): Safe transition to appropriate next level of care at discharge, Engage patient in therapeutic group addressing interpersonal concerns.  Short Term Goals: Engage patient in aftercare planning with referrals and resources, Facilitate acceptance of mental health diagnosis and concerns and Increase skills for wellness and recovery  Therapeutic Interventions: Assess for all discharge needs, 1 to 1 time with Social worker, Explore available resources and support systems, Assess for adequacy in community support network, Educate family and significant other(s) on suicide prevention, Complete Psychosocial Assessment, Interpersonal group therapy.  Evaluation of Outcomes: Not Met   Progress in Treatment: Attending groups: Pt is new to milieu, continuing to assess  Participating in  groups: Pt is new to milieu, continuing to assess  Taking medication as prescribed: Yes, MD continues to assess for medication changes as needed Toleration medication: Yes, no side effects reported at this time Family/Significant other contact made: Yes with son Patient understands diagnosis: Continuing to assess Discussing patient identified problems/goals with staff: Yes Medical problems stabilized or resolved: Yes Denies suicidal/homicidal ideation: Yes Issues/concerns per patient self-inventory: None Other: N/A  New problem(s) identified: None identified at this time.   New Short Term/Long Term Goal(s): None identified at this time.   Discharge Plan or Barriers: Pt will return home and follow-up with outpatient services.  Pt is also considering possibly living with her son at discharge  Reason for Continuation of Hospitalization: Anxiety Depression Medication stabilization  Estimated Length of Stay: 2-3 days  Attendees: Patient: 09/03/2016  8:41 AM  Physician: Dr. Parke Poisson; Dr. Shea Evans 09/03/2016  8:41 AM  Nursing: Darrol Angel, Festus Aloe, RN 09/03/2016  8:41 AM  RN Care Manager: Lars Pinks, RN 09/03/2016  8:41 AM  Social Worker: Adriana Reams, LCSW; [place], LCSW 09/03/2016  8:41 AM  Recreational Therapist:  09/03/2016  8:41 AM  Other: Lindell Spar, NP 09/03/2016  8:41 AM  Other:  09/03/2016  8:41 AM  Other: 09/03/2016  8:41 AM    Scribe for Treatment Team: Gladstone Lighter, LCSW 09/03/2016 8:41 AM

## 2016-09-03 NOTE — Progress Notes (Signed)
DAR NOTE:  Pt behavior cooperative, pleasant on approach. Pt not voicing any complaints at this time. Pt denies SI/HI/AVH. Per pt self inventory form pt reports she slept good last night. Pt reports a good appetite, normal energy level, good concentration. Pt rates depression 0/10, hopelessness 0/10, anxiety 0/10- all on 0-10 scale, 10 being the worse. Pt denies withdrawing from drugs or alcohol. Pt denies physical problems. Pt reports her goal is "stay positive and happy." Special checks q 15 mins in place for safety. Will continue to monitor.

## 2016-09-04 DIAGNOSIS — Z789 Other specified health status: Secondary | ICD-10-CM

## 2016-09-04 DIAGNOSIS — Z7289 Other problems related to lifestyle: Secondary | ICD-10-CM

## 2016-09-04 DIAGNOSIS — F322 Major depressive disorder, single episode, severe without psychotic features: Secondary | ICD-10-CM

## 2016-09-04 MED ORDER — HYDROXYZINE HCL 25 MG PO TABS: 25.0000 mg | ORAL_TABLET | Freq: Three times a day (TID) | ORAL | 0 refills | Status: DC | PRN

## 2016-09-04 MED ORDER — BUSPIRONE HCL 5 MG PO TABS: 5.0000 mg | ORAL_TABLET | Freq: Two times a day (BID) | ORAL | 0 refills | Status: DC

## 2016-09-04 MED ORDER — TRAZODONE HCL 50 MG PO TABS: 50.0000 mg | ORAL_TABLET | Freq: Every evening | ORAL | 0 refills | Status: DC | PRN

## 2016-09-04 MED ORDER — NICOTINE 21 MG/24HR TD PT24: 21.0000 mg | MEDICATED_PATCH | Freq: Every day | TRANSDERMAL | 0 refills | Status: DC

## 2016-09-04 NOTE — Progress Notes (Signed)
Pt discharged from [place] ambulatory and alert. Denies SI/HI/AVH, Pain. AVS, BH Transition record, SRA, Rx, medication supply and personal belongings returned to pt. Pt left with son without incident.
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
## 2016-09-04 NOTE — Progress Notes (Signed)
Patient ID: Kayla Chang, female   DOB: 08/27/1961, 56 y.o.   MRN: [id number]   D: Patient pleasant and smiling on approach tonight. Reports doesn't like to take a lot of medications so declined the sleep medication when offered. Reports she is afraid she will not wake up for breakfast if she takes it. Contracts for safety. No active SI. Reports still has some anxiety but that is stable at present. A: Staff will monitor on q 15 minute checks, follow treatment plan, and give medications as ordered. R: Cooperative on the unit.

## 2016-09-04 NOTE — Progress Notes (Signed)
D: Received pt in her room calm and in a pleasant mood. Rates her depression 0/10. Her goals for today are to continue with group and coping skills learned. Pt also verbalized the desire to be discharged.  A: Praised and encouraged pt as well as given emotional support. Safety checks maintained.  R: Pt contracted for safety. Cooperative with meds and treatment. Verbalized no complaints.

## 2016-09-04 NOTE — Progress Notes (Signed)
  [facility] Adult Case Management Discharge Plan :  Will you be returning to the same living situation after discharge:  Yes,  lives alone At discharge, do you have transportation home?: Yes,  son to pick up Do you have the ability to pay for your medications: Yes,  disability and insurance  Release of information consent forms completed and turned in.  Patient to Follow up at:    Patient declines follow-up referrals.           Next level of care provider has access to [place] and Suicide Prevention discussed: Yes,  with patient and with son  Have you used any form of tobacco in the last 30 days? (Cigarettes, Smokeless Tobacco, Cigars, and/or Pipes): Yes  Has patient been referred to the Quitline?: Patient refused referral  Patient has been referred for addiction treatment: N/A  Maretta Los 09/04/2016, 12:17 PM
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
## 2016-09-04 NOTE — [facility] Suicide Risk Assessment (Signed)
**Note Kayla-Identified via Obfuscation** [facility] Discharge Suicide Risk Assessment   Principal Problem: Overdose of benzodiazepine Discharge Diagnoses:  Patient Active Problem List   Diagnosis Date Noted  . Overdose of benzodiazepine [T42.4X1A] 09/02/2016  . Major depressive disorder, recurrent episode, severe ([place]) [F33.2] 09/01/2016  . Major depressive disorder, single episode, severe without psychotic features ([place]) [F32.2] 09/01/2016    Total Time spent with patient: 30 minutes  Musculoskeletal: Strength & Muscle Tone: within normal limits Gait & Station: normal Patient leans: no lean  Psychiatric Specialty Exam: Review of Systems  Constitutional: Negative for fever.  Cardiovascular: Negative for chest pain.  Gastrointestinal: Negative for nausea.    Blood pressure 100/84, pulse 97, temperature 97.8 F (36.6 C), temperature source Oral, resp. rate 18, height 5' 6.5" (1.689 m), weight 74.8 kg (165 lb).Body mass index is 26.23 kg/m.  General Appearance: Casual  Eye Contact::  Fair  Speech:  Normal Rate409  Volume:  Normal  Mood:  Euthymic  Affect:  Congruent  Thought Process:  Goal Directed  Orientation:  Full (Time, Place, and Person)  Thought Content:  Logical  Suicidal Thoughts:  No  Homicidal Thoughts:  No  Memory:  Immediate;   Fair Recent;   Fair  Judgement:  Fair  Insight:  Fair  Psychomotor Activity:  Normal  Concentration:  Fair  Recall:  [place] of Knowledge:Good  Language: Good  Akathisia:  Negative  Handed:  Right  AIMS (if indicated):     Assets:  Desire for Improvement  Sleep:  Number of Hours: 6.75  Cognition: WNL  ADL's:  Intact   Mental Status Per Nursing Assessment::   On Admission:  Suicidal ideation indicated by patient, Suicide plan, Plan includes specific time, place, or method, Self-harm thoughts, Self-harm behaviors  Demographic Factors:  Low socioeconomic status and middle age female  Loss Factors: Financial problems/change in socioeconomic status  Historical  Factors: Impulsivity  Risk Reduction Factors:   Sense of responsibility to family and Positive therapeutic relationship  Continued Clinical Symptoms:  Dysthymia but improved with good insight now and no suicidal toughts.  Cognitive Features That Contribute To Risk:  None    Suicide Risk:  Minimal: No identifiable suicidal ideation.  Patients presenting with no risk factors but with morbid ruminations; may be classified as minimal risk based on the severity of the depressive symptoms    Plan Of Care/Follow-up recommendations:  Activity:  as tolerated Diet:  regular  Avoid alcohol. No withdrawals. Vitals stable.  Follow up with recommendations and reviewed referrals and sobriety  Kayla Chang, Kayla Barrie, MD 09/04/2016, 11:14 AM

## 2016-09-21 DIAGNOSIS — F419 Anxiety disorder, unspecified: Secondary | ICD-10-CM | POA: Diagnosis not present

## 2016-09-21 DIAGNOSIS — F3289 Other specified depressive episodes: Secondary | ICD-10-CM | POA: Diagnosis not present

## 2016-09-21 DIAGNOSIS — Z09 Encounter for follow-up examination after completed treatment for conditions other than malignant neoplasm: Secondary | ICD-10-CM | POA: Diagnosis not present

## 2016-09-21 DIAGNOSIS — F4310 Post-traumatic stress disorder, unspecified: Secondary | ICD-10-CM | POA: Diagnosis not present

## 2016-11-18 DIAGNOSIS — F3132 Bipolar disorder, current episode depressed, moderate: Secondary | ICD-10-CM | POA: Diagnosis not present

## 2016-11-18 DIAGNOSIS — F419 Anxiety disorder, unspecified: Secondary | ICD-10-CM | POA: Diagnosis not present

## 2016-11-18 DIAGNOSIS — F3289 Other specified depressive episodes: Secondary | ICD-10-CM | POA: Diagnosis not present

## 2016-11-18 DIAGNOSIS — F4310 Post-traumatic stress disorder, unspecified: Secondary | ICD-10-CM | POA: Diagnosis not present

## 2016-12-16 DIAGNOSIS — H6121 Impacted cerumen, right ear: Secondary | ICD-10-CM | POA: Diagnosis not present

## 2016-12-16 DIAGNOSIS — F4310 Post-traumatic stress disorder, unspecified: Secondary | ICD-10-CM | POA: Diagnosis not present

## 2016-12-16 DIAGNOSIS — F419 Anxiety disorder, unspecified: Secondary | ICD-10-CM | POA: Diagnosis not present

## 2016-12-16 DIAGNOSIS — F3289 Other specified depressive episodes: Secondary | ICD-10-CM | POA: Diagnosis not present

## 2017-02-09 DIAGNOSIS — F419 Anxiety disorder, unspecified: Secondary | ICD-10-CM | POA: Diagnosis not present

## 2017-02-09 DIAGNOSIS — F4310 Post-traumatic stress disorder, unspecified: Secondary | ICD-10-CM | POA: Diagnosis not present

## 2017-02-09 DIAGNOSIS — F3289 Other specified depressive episodes: Secondary | ICD-10-CM | POA: Diagnosis not present

## 2017-02-22 DIAGNOSIS — R21 Rash and other nonspecific skin eruption: Secondary | ICD-10-CM | POA: Diagnosis not present

## 2017-03-03 DIAGNOSIS — L509 Urticaria, unspecified: Secondary | ICD-10-CM | POA: Diagnosis not present

## 2017-03-17 DIAGNOSIS — Z0000 Encounter for general adult medical examination without abnormal findings: Secondary | ICD-10-CM | POA: Diagnosis not present

## 2017-03-17 DIAGNOSIS — F3289 Other specified depressive episodes: Secondary | ICD-10-CM | POA: Diagnosis not present

## 2017-03-17 DIAGNOSIS — M7989 Other specified soft tissue disorders: Secondary | ICD-10-CM | POA: Diagnosis not present

## 2017-03-17 DIAGNOSIS — F419 Anxiety disorder, unspecified: Secondary | ICD-10-CM | POA: Diagnosis not present

## 2017-06-16 DIAGNOSIS — M7989 Other specified soft tissue disorders: Secondary | ICD-10-CM | POA: Diagnosis not present

## 2017-06-16 DIAGNOSIS — F419 Anxiety disorder, unspecified: Secondary | ICD-10-CM | POA: Diagnosis not present

## 2017-06-16 DIAGNOSIS — M6740 Ganglion, unspecified site: Secondary | ICD-10-CM | POA: Diagnosis not present

## 2017-06-16 DIAGNOSIS — F3289 Other specified depressive episodes: Secondary | ICD-10-CM | POA: Diagnosis not present

## 2017-06-16 DIAGNOSIS — F4310 Post-traumatic stress disorder, unspecified: Secondary | ICD-10-CM | POA: Diagnosis not present

## 2017-06-22 ENCOUNTER — Other Ambulatory Visit: Payer: Self-pay | Admitting: Family Medicine

## 2017-06-22 DIAGNOSIS — Z1231 Encounter for screening mammogram for malignant neoplasm of breast: Secondary | ICD-10-CM

## 2017-07-06 ENCOUNTER — Ambulatory Visit (INDEPENDENT_AMBULATORY_CARE_PROVIDER_SITE_OTHER): Payer: Medicare Other

## 2017-07-06 DIAGNOSIS — Z1231 Encounter for screening mammogram for malignant neoplasm of breast: Secondary | ICD-10-CM | POA: Diagnosis not present

## 2017-07-08 ENCOUNTER — Ambulatory Visit: Payer: Self-pay

## 2017-09-22 DIAGNOSIS — F3289 Other specified depressive episodes: Secondary | ICD-10-CM | POA: Diagnosis not present

## 2017-09-22 DIAGNOSIS — F3132 Bipolar disorder, current episode depressed, moderate: Secondary | ICD-10-CM | POA: Diagnosis not present

## 2017-09-22 DIAGNOSIS — M7989 Other specified soft tissue disorders: Secondary | ICD-10-CM | POA: Diagnosis not present

## 2017-09-22 DIAGNOSIS — M6740 Ganglion, unspecified site: Secondary | ICD-10-CM | POA: Diagnosis not present

## 2017-09-22 DIAGNOSIS — F419 Anxiety disorder, unspecified: Secondary | ICD-10-CM | POA: Diagnosis not present

## 2018-03-03 DIAGNOSIS — K449 Diaphragmatic hernia without obstruction or gangrene: Secondary | ICD-10-CM | POA: Diagnosis not present

## 2018-03-03 DIAGNOSIS — M79605 Pain in left leg: Secondary | ICD-10-CM | POA: Diagnosis not present

## 2018-03-03 DIAGNOSIS — R079 Chest pain, unspecified: Secondary | ICD-10-CM | POA: Diagnosis not present

## 2018-03-03 DIAGNOSIS — M79604 Pain in right leg: Secondary | ICD-10-CM | POA: Diagnosis not present

## 2018-03-03 DIAGNOSIS — J439 Emphysema, unspecified: Secondary | ICD-10-CM | POA: Diagnosis not present

## 2018-03-03 DIAGNOSIS — R0789 Other chest pain: Secondary | ICD-10-CM | POA: Diagnosis not present

## 2018-03-03 DIAGNOSIS — Z885 Allergy status to narcotic agent status: Secondary | ICD-10-CM | POA: Diagnosis not present

## 2018-03-03 DIAGNOSIS — Z79899 Other long term (current) drug therapy: Secondary | ICD-10-CM | POA: Diagnosis not present

## 2018-03-03 DIAGNOSIS — R9431 Abnormal electrocardiogram [ECG] [EKG]: Secondary | ICD-10-CM | POA: Diagnosis not present

## 2018-03-03 DIAGNOSIS — R0602 Shortness of breath: Secondary | ICD-10-CM | POA: Diagnosis not present

## 2018-03-03 DIAGNOSIS — Z888 Allergy status to other drugs, medicaments and biological substances status: Secondary | ICD-10-CM | POA: Diagnosis not present

## 2018-03-03 DIAGNOSIS — F419 Anxiety disorder, unspecified: Secondary | ICD-10-CM | POA: Diagnosis not present

## 2018-03-23 DIAGNOSIS — M4726 Other spondylosis with radiculopathy, lumbar region: Secondary | ICD-10-CM | POA: Diagnosis not present

## 2018-03-23 DIAGNOSIS — Z1211 Encounter for screening for malignant neoplasm of colon: Secondary | ICD-10-CM | POA: Diagnosis not present

## 2018-03-23 DIAGNOSIS — G629 Polyneuropathy, unspecified: Secondary | ICD-10-CM | POA: Diagnosis not present

## 2018-03-23 DIAGNOSIS — R1031 Right lower quadrant pain: Secondary | ICD-10-CM | POA: Diagnosis not present

## 2018-03-23 DIAGNOSIS — M6240 Contracture of muscle, unspecified site: Secondary | ICD-10-CM | POA: Diagnosis not present

## 2018-03-23 DIAGNOSIS — Z23 Encounter for immunization: Secondary | ICD-10-CM | POA: Diagnosis not present

## 2018-03-23 DIAGNOSIS — F4310 Post-traumatic stress disorder, unspecified: Secondary | ICD-10-CM | POA: Diagnosis not present

## 2018-03-23 DIAGNOSIS — R7303 Prediabetes: Secondary | ICD-10-CM | POA: Diagnosis not present

## 2018-03-23 DIAGNOSIS — M160 Bilateral primary osteoarthritis of hip: Secondary | ICD-10-CM | POA: Diagnosis not present

## 2018-03-23 DIAGNOSIS — F419 Anxiety disorder, unspecified: Secondary | ICD-10-CM | POA: Diagnosis not present

## 2018-03-23 DIAGNOSIS — R1032 Left lower quadrant pain: Secondary | ICD-10-CM | POA: Diagnosis not present

## 2018-04-17 DIAGNOSIS — M25551 Pain in right hip: Secondary | ICD-10-CM | POA: Diagnosis not present

## 2018-04-17 DIAGNOSIS — M25552 Pain in left hip: Secondary | ICD-10-CM | POA: Diagnosis not present

## 2018-04-20 DIAGNOSIS — Z1211 Encounter for screening for malignant neoplasm of colon: Secondary | ICD-10-CM | POA: Diagnosis not present

## 2018-04-24 ENCOUNTER — Other Ambulatory Visit: Payer: Self-pay | Admitting: Sports Medicine

## 2018-04-24 DIAGNOSIS — M25552 Pain in left hip: Secondary | ICD-10-CM

## 2018-04-27 ENCOUNTER — Ambulatory Visit
Admission: RE | Admit: 2018-04-27 | Discharge: 2018-04-27 | Disposition: A | Discharge status: Home / Self Care | Payer: Medicare Other | Source: Ambulatory Visit | Attending: Sports Medicine | Admitting: Sports Medicine

## 2018-04-27 DIAGNOSIS — M25552 Pain in left hip: Secondary | ICD-10-CM

## 2018-04-27 DIAGNOSIS — M160 Bilateral primary osteoarthritis of hip: Secondary | ICD-10-CM | POA: Diagnosis not present

## 2018-05-03 DIAGNOSIS — M25552 Pain in left hip: Secondary | ICD-10-CM | POA: Diagnosis not present

## 2018-05-15 DIAGNOSIS — M25552 Pain in left hip: Secondary | ICD-10-CM | POA: Diagnosis not present

## 2018-05-15 DIAGNOSIS — M25551 Pain in right hip: Secondary | ICD-10-CM | POA: Diagnosis not present

## 2018-05-15 DIAGNOSIS — M1612 Unilateral primary osteoarthritis, left hip: Secondary | ICD-10-CM | POA: Diagnosis not present

## 2018-05-19 ENCOUNTER — Other Ambulatory Visit: Payer: Self-pay | Admitting: Family Medicine

## 2018-05-19 DIAGNOSIS — Z86010 Personal history of colonic polyps: Secondary | ICD-10-CM | POA: Diagnosis not present

## 2018-05-19 DIAGNOSIS — R195 Other fecal abnormalities: Secondary | ICD-10-CM | POA: Diagnosis not present

## 2018-05-19 DIAGNOSIS — Z1239 Encounter for other screening for malignant neoplasm of breast: Secondary | ICD-10-CM

## 2018-05-22 DIAGNOSIS — M1612 Unilateral primary osteoarthritis, left hip: Secondary | ICD-10-CM | POA: Diagnosis not present

## 2018-05-30 DIAGNOSIS — R195 Other fecal abnormalities: Secondary | ICD-10-CM | POA: Diagnosis not present

## 2018-05-30 DIAGNOSIS — K635 Polyp of colon: Secondary | ICD-10-CM | POA: Diagnosis not present

## 2018-05-30 DIAGNOSIS — Z86010 Personal history of colonic polyps: Secondary | ICD-10-CM | POA: Diagnosis not present

## 2018-05-30 DIAGNOSIS — D122 Benign neoplasm of ascending colon: Secondary | ICD-10-CM | POA: Diagnosis not present

## 2018-05-30 DIAGNOSIS — K640 First degree hemorrhoids: Secondary | ICD-10-CM | POA: Diagnosis not present

## 2018-05-30 DIAGNOSIS — D128 Benign neoplasm of rectum: Secondary | ICD-10-CM | POA: Diagnosis not present

## 2018-06-02 DIAGNOSIS — K635 Polyp of colon: Secondary | ICD-10-CM | POA: Diagnosis not present

## 2018-06-02 DIAGNOSIS — D122 Benign neoplasm of ascending colon: Secondary | ICD-10-CM | POA: Diagnosis not present

## 2018-06-02 DIAGNOSIS — D128 Benign neoplasm of rectum: Secondary | ICD-10-CM | POA: Diagnosis not present

## 2018-06-22 DIAGNOSIS — Z124 Encounter for screening for malignant neoplasm of cervix: Secondary | ICD-10-CM | POA: Diagnosis not present

## 2018-06-22 DIAGNOSIS — F3132 Bipolar disorder, current episode depressed, moderate: Secondary | ICD-10-CM | POA: Diagnosis not present

## 2018-06-22 DIAGNOSIS — F419 Anxiety disorder, unspecified: Secondary | ICD-10-CM | POA: Diagnosis not present

## 2018-06-22 DIAGNOSIS — F3289 Other specified depressive episodes: Secondary | ICD-10-CM | POA: Diagnosis not present

## 2018-06-22 DIAGNOSIS — M160 Bilateral primary osteoarthritis of hip: Secondary | ICD-10-CM | POA: Diagnosis not present

## 2018-06-22 DIAGNOSIS — G629 Polyneuropathy, unspecified: Secondary | ICD-10-CM | POA: Diagnosis not present

## 2018-06-22 DIAGNOSIS — M1990 Unspecified osteoarthritis, unspecified site: Secondary | ICD-10-CM | POA: Diagnosis not present

## 2018-06-22 DIAGNOSIS — F4310 Post-traumatic stress disorder, unspecified: Secondary | ICD-10-CM | POA: Diagnosis not present

## 2018-06-22 DIAGNOSIS — M4726 Other spondylosis with radiculopathy, lumbar region: Secondary | ICD-10-CM | POA: Diagnosis not present

## 2018-07-07 ENCOUNTER — Ambulatory Visit (INDEPENDENT_AMBULATORY_CARE_PROVIDER_SITE_OTHER): Payer: Medicare Other

## 2018-07-07 ENCOUNTER — Other Ambulatory Visit: Payer: Self-pay | Admitting: Family Medicine

## 2018-07-07 DIAGNOSIS — Z1231 Encounter for screening mammogram for malignant neoplasm of breast: Secondary | ICD-10-CM

## 2018-07-07 DIAGNOSIS — Z1239 Encounter for other screening for malignant neoplasm of breast: Secondary | ICD-10-CM

## 2018-08-03 DIAGNOSIS — M1612 Unilateral primary osteoarthritis, left hip: Secondary | ICD-10-CM | POA: Diagnosis not present

## 2018-08-30 HISTORY — PX: COLONOSCOPY: SHX174

## 2018-09-19 DIAGNOSIS — L249 Irritant contact dermatitis, unspecified cause: Secondary | ICD-10-CM | POA: Diagnosis not present

## 2018-09-19 DIAGNOSIS — Z01810 Encounter for preprocedural cardiovascular examination: Secondary | ICD-10-CM | POA: Diagnosis not present

## 2018-10-18 NOTE — H&P (Signed)
TOTAL HIP ADMISSION H&P  Patient is admitted for left total hip arthroplasty.  Subjective:  Chief Complaint: left hip pain  HPI: Kayla Chang, 58 y.o. female, has a history of pain and functional disability in the left hip(s) due to arthritis and patient has failed non-surgical conservative treatments for greater than 12 weeks to include corticosteriod injections and activity modification.  Onset of symptoms was gradual starting 4 years ago with gradually worsening course since that time.The patient noted no past surgery on the left hip(s).  Patient currently rates pain in the left hip at 10 out of 10 with activity. Patient has worsening of pain with activity and weight bearing and instability. Patient has evidence of significant joint space narrowing with marginal osteophyte formation and small subchondral cysts by imaging studies. This condition presents safety issues increasing the risk of falls. There is no current active infection.  Patient Active Problem List   Diagnosis Date Noted  . Alcohol use   . Overdose of benzodiazepine 09/02/2016  . Major depressive disorder, recurrent episode, severe ([place]) 09/01/2016  . Major depressive disorder, single episode, severe without psychotic features ([place]) 09/01/2016   Past Medical History:  Diagnosis Date  . Cancer [facility]) 2008   breast cancer, colon cancer    Past Surgical History:  Procedure Laterality Date  . AUGMENTATION MAMMAPLASTY    . BREAST SURGERY      No current facility-administered medications for this encounter.    Current Outpatient Medications  Medication Sig Dispense Refill Last Dose  . busPIRone (BUSPAR) 5 MG tablet Take 1 tablet (5 mg total) by mouth 2 (two) times daily. 60 tablet 0   . hydrOXYzine (ATARAX/VISTARIL) 25 MG tablet Take 1 tablet (25 mg total) by mouth 3 (three) times daily as needed for anxiety. 60 tablet 0   . nicotine (NICODERM CQ - DOSED IN MG/24 HOURS) 21 mg/24hr patch Place 1 patch (21 mg total) onto  the skin daily. 28 patch 0   . omeprazole (PRILOSEC OTC) 20 MG tablet Take 20 mg by mouth daily.   08/31/2016 at Unknown time  . traZODone (DESYREL) 50 MG tablet Take 1 tablet (50 mg total) by mouth at bedtime as needed for sleep. 30 tablet 0    Allergies  Allergen Reactions  . Morphine And Related Hives    Hives, hallucinations  . Vicodin [Hydrocodone-Acetaminophen] Hives and Itching    Social History   Tobacco Use  . Smoking status: Current Every Day Smoker    Packs/day: 1.00    Types: Cigarettes  . Smokeless tobacco: Never Used  Substance Use Topics  . Alcohol use: Yes    Alcohol/week: 4.0 standard drinks    Types: 4 Cans of beer per week    No family history on file.   Review of Systems  Constitutional: Negative for chills and fever.  HENT: Negative for congestion, sore throat and tinnitus.   Eyes: Negative for double vision, photophobia and pain.  Respiratory: Negative for cough, shortness of breath and wheezing.   Cardiovascular: Negative for chest pain, palpitations and orthopnea.  Gastrointestinal: Negative for heartburn, nausea and vomiting.  Genitourinary: Negative for dysuria, frequency and urgency.  Musculoskeletal: Positive for joint pain.  Neurological: Negative for dizziness, weakness and headaches.    Objective:  Physical Exam  Well nourished and well developed.  General: Alert and oriented x3, cooperative and pleasant, no acute distress.  Head: normocephalic, atraumatic, neck supple.  Eyes: EOMI.  Respiratory: breath sounds clear in all fields, no wheezing, rales,  or rhonchi. Cardiovascular: Regular rate and rhythm, no murmurs, gallops or rubs.  Abdomen: non-tender to palpation and soft, normoactive bowel sounds. Musculoskeletal:  Left Hip Exam: ROM: Flexion to 100, No internal Rotation, External Rotation to 20, and abduction 20 with pain. There is no tenderness over the greater trochanter bursa. There is pain on provocative testing of the hip.  Calves  soft and nontender. Motor function intact in LE. Strength 5/5 LE bilaterally. Neuro: Distal pulses 2+. Sensation to light touch intact in LE.  Vital signs in last 24 hours: Blood pressure: 136/96 mmHg Pulse: 68 bpm  Labs:   Estimated body mass index is 26.23 kg/m as calculated from the following:   Height as of 09/01/16: 5' 6.5" (1.689 m).   Weight as of 09/01/16: 74.8 kg.   Imaging Review Plain radiographs demonstrate severe degenerative joint disease of the left hip(s). The bone quality appears to be adequate for age and reported activity level.      Assessment/Plan:  End stage arthritis, left hip(s)  The patient history, physical examination, clinical judgement of the provider and imaging studies are consistent with end stage degenerative joint disease of the left hip(s) and total hip arthroplasty is deemed medically necessary. The treatment options including medical management, injection therapy, arthroscopy and arthroplasty were discussed at length. The risks and benefits of total hip arthroplasty were presented and reviewed. The risks due to aseptic loosening, infection, stiffness, dislocation/subluxation,  thromboembolic complications and other imponderables were discussed.  The patient acknowledged the explanation, agreed to proceed with the plan and consent was signed. Patient is being admitted for inpatient treatment for surgery, pain control, PT, OT, prophylactic antibiotics, VTE prophylaxis, progressive ambulation and ADL's and discharge planning.The patient is planning to be discharged home.  Therapy Plans: HEP Disposition: Home with son Planned DVT Prophylaxis: Aspirin 325 mg BID DME needed: [facility] PCP: Crissie Sickles, DO TXA: IV Allergies: Nickel, codeine (itching), morphine (hallucinations), adhesive tape Anesthesia Concerns: None BMI: 26.1 Other: Nickel allergy - will need ceramic ball   - Patient was instructed on what medications to stop prior to surgery. -  Follow-up visit in 2 weeks with Dr. Wynelle Link - Begin physical therapy following surgery - Pre-operative lab work as pre-surgical testing - Prescriptions will be provided in hospital at time of discharge  Theresa Duty, PA-C Orthopedic Surgery [facility] [place]

## 2018-10-25 ENCOUNTER — Encounter (HOSPITAL_COMMUNITY): Payer: Self-pay

## 2018-10-25 NOTE — Patient Instructions (Addendum)
Kayla Chang  10/25/2018   Your procedure is scheduled on: 11-01-2018    Report to [facility] Main  Entrance     Report to admitting at 9:15AM      Call this number if you have problems the morning of surgery [phone]     Remember: Do not eat food or drink liquids :After Midnight. BRUSH YOUR TEETH MORNING OF SURGERY AND RINSE YOUR MOUTH OUT, NO CHEWING GUM CANDY OR MINTS.     Take these medicines the morning of surgery with A SIP OF WATER: buspirone, escitalopram, tylenol                                 You may not have any metal on your body including hair pins and              piercings  Do not wear jewelry, make-up, lotions, powders or perfumes, deodorant             Do not wear nail polish.  Do not shave  48 hours prior to surgery.                 Do not bring valuables to the hospital. [place].  Contacts, dentures or bridgework may not be worn into surgery.  Leave suitcase in the car. After surgery it may be brought to your room.                  Please read over the following fact sheets you were given: _____________________________________________________________________             [facility] - Preparing for Surgery Before surgery, you can play an important role.  Because skin is not sterile, your skin needs to be as free of germs as possible.  You can reduce the number of germs on your skin by washing with CHG (chlorahexidine gluconate) soap before surgery.  CHG is an antiseptic cleaner which kills germs and bonds with the skin to continue killing germs even after washing. Please DO NOT use if you have an allergy to CHG or antibacterial soaps.  If your skin becomes reddened/irritated stop using the CHG and inform your nurse when you arrive at Short Stay. Do not shave (including legs and underarms) for at least 48 hours prior to the first CHG shower.  You may shave your  face/neck. Please follow these instructions carefully:  1.  Shower with CHG Soap the night before surgery and the  morning of Surgery.  2.  If you choose to wash your hair, wash your hair first as usual with your  normal  shampoo.  3.  After you shampoo, rinse your hair and body thoroughly to remove the  shampoo.                           4.  Use CHG as you would any other liquid soap.  You can apply chg directly  to the skin and wash                       Gently with a scrungie or clean washcloth.  5.  Apply the CHG Soap to your  body ONLY FROM THE NECK DOWN.   Do not use on face/ open                           Wound or open sores. Avoid contact with eyes, ears mouth and genitals (private parts).                       Wash face,  Genitals (private parts) with your normal soap.             6.  Wash thoroughly, paying special attention to the area where your surgery  will be performed.  7.  Thoroughly rinse your body with warm water from the neck down.  8.  DO NOT shower/wash with your normal soap after using and rinsing off  the CHG Soap.                9.  Pat yourself dry with a clean towel.            10.  Wear clean pajamas.            11.  Place clean sheets on your bed the night of your first shower and do not  sleep with pets. Day of Surgery : Do not apply any lotions/deodorants the morning of surgery.  Please wear clean clothes to the hospital/surgery center.  FAILURE TO FOLLOW THESE INSTRUCTIONS MAY RESULT IN THE CANCELLATION OF YOUR SURGERY PATIENT SIGNATURE_________________________________  NURSE SIGNATURE__________________________________  ________________________________________________________________________   Kayla Chang  An incentive spirometer is a tool that can help keep your lungs clear and active. This tool measures how well you are filling your lungs with each breath. Taking long deep breaths may help reverse or decrease the chance of developing breathing  (pulmonary) problems (especially infection) following:  A long period of time when you are unable to move or be active. BEFORE THE PROCEDURE   If the spirometer includes an indicator to show your best effort, your nurse or respiratory therapist will set it to a desired goal.  If possible, sit up straight or lean slightly forward. Try not to slouch.  Hold the incentive spirometer in an upright position. INSTRUCTIONS FOR USE  1. Sit on the edge of your bed if possible, or sit up as far as you can in bed or on a chair. 2. Hold the incentive spirometer in an upright position. 3. Breathe out normally. 4. Place the mouthpiece in your mouth and seal your lips tightly around it. 5. Breathe in slowly and as deeply as possible, raising the piston or the ball toward the top of the column. 6. Hold your breath for 3-5 seconds or for as long as possible. Allow the piston or ball to fall to the bottom of the column. 7. Remove the mouthpiece from your mouth and breathe out normally. 8. Rest for a few seconds and repeat Steps 1 through 7 at least 10 times every 1-2 hours when you are awake. Take your time and take a few normal breaths between deep breaths. 9. The spirometer may include an indicator to show your best effort. Use the indicator as a goal to work toward during each repetition. 10. After each set of 10 deep breaths, practice coughing to be sure your lungs are clear. If you have an incision (the cut made at the time of surgery), support your incision when coughing by placing a pillow or rolled up towels firmly against it.  Once you are able to get out of bed, walk around indoors and cough well. You may stop using the incentive spirometer when instructed by your caregiver.  RISKS AND COMPLICATIONS  Take your time so you do not get dizzy or light-headed.  If you are in pain, you may need to take or ask for pain medication before doing incentive spirometry. It is harder to take a deep breath if you  are having pain. AFTER USE  Rest and breathe slowly and easily.  It can be helpful to keep track of a log of your progress. Your caregiver can provide you with a simple table to help with this. If you are using the spirometer at home, follow these instructions: [place] IF:   You are having difficultly using the spirometer.  You have trouble using the spirometer as often as instructed.  Your pain medication is not giving enough relief while using the spirometer.  You develop fever of 100.5 F (38.1 C) or higher. SEEK IMMEDIATE MEDICAL CARE IF:   You cough up bloody sputum that had not been present before.  You develop fever of 102 F (38.9 C) or greater.  You develop worsening pain at or near the incision site. MAKE SURE YOU:   Understand these instructions.  Will watch your condition.  Will get help right away if you are not doing well or get worse. Document Released: 12/27/2006 Document Revised: 11/08/2011 Document Reviewed: 02/27/2007 ExitCare Patient Information 2014 Exit[employer], [place].   ________________________________________________________________________  WHAT IS A BLOOD TRANSFUSION? Blood Transfusion Information  A transfusion is the replacement of blood or some of its parts. Blood is made up of multiple cells which provide different functions.  Red blood cells carry oxygen and are used for blood loss replacement.  White blood cells fight against infection.  Platelets control bleeding.  Plasma helps clot blood.  Other blood products are available for specialized needs, such as hemophilia or other clotting disorders. BEFORE THE TRANSFUSION  Who gives blood for transfusions?   Healthy volunteers who are fully evaluated to make sure their blood is safe. This is blood bank blood. Transfusion therapy is the safest it has ever been in the practice of medicine. Before blood is taken from a donor, a complete history is taken to make sure that person has  no history of diseases nor engages in risky social behavior (examples are intravenous drug use or sexual activity with multiple partners). The donor's travel history is screened to minimize risk of transmitting infections, such as malaria. The donated blood is tested for signs of infectious diseases, such as HIV and hepatitis. The blood is then tested to be sure it is compatible with you in order to minimize the chance of a transfusion reaction. If you or a relative donates blood, this is often done in anticipation of surgery and is not appropriate for emergency situations. It takes many days to process the donated blood. RISKS AND COMPLICATIONS Although transfusion therapy is very safe and saves many lives, the main dangers of transfusion include:   Getting an infectious disease.  Developing a transfusion reaction. This is an allergic reaction to something in the blood you were given. Every precaution is taken to prevent this. The decision to have a blood transfusion has been considered carefully by your caregiver before blood is given. Blood is not given unless the benefits outweigh the risks. AFTER THE TRANSFUSION  Right after receiving a blood transfusion, you will usually feel much better and more energetic.  This is especially true if your red blood cells have gotten low (anemic). The transfusion raises the level of the red blood cells which carry oxygen, and this usually causes an energy increase.  The nurse administering the transfusion will monitor you carefully for complications. HOME CARE INSTRUCTIONS  No special instructions are needed after a transfusion. You may find your energy is better. Speak with your caregiver about any limitations on activity for underlying diseases you may have. SEEK MEDICAL CARE IF:   Your condition is not improving after your transfusion.  You develop redness or irritation at the intravenous (IV) site. SEEK IMMEDIATE MEDICAL CARE IF:  Any of the following  symptoms occur over the next 12 hours:  Shaking chills.  You have a temperature by mouth above 102 F (38.9 C), not controlled by medicine.  Chest, back, or muscle pain.  People around you feel you are not acting correctly or are confused.  Shortness of breath or difficulty breathing.  Dizziness and fainting.  You get a rash or develop hives.  You have a decrease in urine output.  Your urine turns a dark color or changes to pink, red, or brown. Any of the following symptoms occur over the next 10 days:  You have a temperature by mouth above 102 F (38.9 C), not controlled by medicine.  Shortness of breath.  Weakness after normal activity.  The white part of the eye turns yellow (jaundice).  You have a decrease in the amount of urine or are urinating less often.  Your urine turns a dark color or changes to pink, red, or brown. Document Released: 08/13/2000 Document Revised: 11/08/2011 Document Reviewed: 04/01/2008 [facility] Patient Information 2014 [place], [place].  _______________________________________________________________________

## 2018-10-25 NOTE — Progress Notes (Signed)
Medical clearance Dr Shelton Silvas 09-19-2018 on chart   ekg 03-03-18 epic care everywhere   Ct pulm angiography 03-03-18 epic

## 2018-10-26 ENCOUNTER — Encounter (HOSPITAL_COMMUNITY): Payer: Self-pay

## 2018-10-26 ENCOUNTER — Encounter (HOSPITAL_COMMUNITY)
Admission: RE | Admit: 2018-10-26 | Discharge: 2018-10-26 | Disposition: A | Discharge status: Home / Self Care | Payer: Medicare Other | Source: Ambulatory Visit | Attending: Orthopedic Surgery | Admitting: Orthopedic Surgery

## 2018-10-26 ENCOUNTER — Other Ambulatory Visit: Payer: Self-pay

## 2018-10-26 DIAGNOSIS — Z01812 Encounter for preprocedural laboratory examination: Secondary | ICD-10-CM | POA: Diagnosis not present

## 2018-10-26 HISTORY — DX: Prediabetes: R73.03

## 2018-10-26 HISTORY — DX: Unspecified contact dermatitis, unspecified cause: L25.9

## 2018-10-26 HISTORY — DX: Post-traumatic stress disorder, unspecified: F43.10

## 2018-10-26 HISTORY — DX: Major depressive disorder, single episode, unspecified: F32.9

## 2018-10-26 HISTORY — DX: Panic disorder (episodic paroxysmal anxiety): F41.0

## 2018-10-26 HISTORY — DX: Depression, unspecified: F32.A

## 2018-10-26 LAB — COMPREHENSIVE METABOLIC PANEL
ALT: 22 U/L (ref 0–44)
AST: 21 U/L (ref 15–41)
Albumin: 4.7 g/dL (ref 3.5–5.0)
Alkaline Phosphatase: 68 U/L (ref 38–126)
Anion gap: 8 (ref 5–15)
BUN: 21 mg/dL — ABNORMAL HIGH (ref 6–20)
CO2: 22 mmol/L (ref 22–32)
CREATININE: 0.8 mg/dL (ref 0.44–1.00)
Calcium: 9.4 mg/dL (ref 8.9–10.3)
Chloride: 111 mmol/L (ref 98–111)
GFR calc Af Amer: >60 mL/min (ref >60)
GFR calc non Af Amer: >60 mL/min (ref >60)
Glucose, Bld: 93 mg/dL (ref 70–99)
Potassium: 4.2 mmol/L (ref 3.5–5.1)
SODIUM: 141 mmol/L (ref 135–145)
Total Bilirubin: 0.6 mg/dL (ref 0.3–1.2)
Total Protein: 7.2 g/dL (ref 6.5–8.1)

## 2018-10-26 LAB — CBC
HCT: 44.9 % (ref 36.0–46.0)
Hemoglobin: 14.7 g/dL (ref 12.0–15.0)
MCH: 32.2 pg (ref 26.0–34.0)
MCHC: 32.7 g/dL (ref 30.0–36.0)
MCV: 98.5 fL (ref 80.0–100.0)
Platelets: 174 10*3/uL (ref 150–400)
RBC: 4.56 MIL/uL (ref 3.87–5.11)
RDW: 12.2 % (ref 11.5–15.5)
WBC: 5.6 10*3/uL (ref 4.0–10.5)
nRBC: 0 % (ref 0.0–0.2)

## 2018-10-26 LAB — HEMOGLOBIN A1C
Hgb A1c MFr Bld: 5.3 % (ref 4.8–5.6)
MEAN PLASMA GLUCOSE: 105.41 mg/dL

## 2018-10-26 LAB — PROTIME-INR
INR: 0.9 (ref 0.8–1.2)
Prothrombin Time: 12 seconds (ref 11.4–15.2)

## 2018-10-26 LAB — APTT: APTT: 25 s (ref 24–36)

## 2018-10-27 LAB — SURGICAL PCR SCREEN
MRSA, PCR: POSITIVE — AB
Staphylococcus aureus: POSITIVE — AB

## 2018-10-27 LAB — ABO/RH: ABO/RH(D): B POS

## 2018-10-31 NOTE — Anesthesia Preprocedure Evaluation (Addendum)
Anesthesia Evaluation  Patient identified by MRN, date of birth, ID band Patient awake    Reviewed: Allergy & Precautions, NPO status , Patient's Chart, lab work & pertinent test results  History of Anesthesia Complications Negative for: history of anesthetic complications  Airway Mallampati: II  TM Distance: >3 FB Neck ROM: Full    Dental  (+) Dental Advisory Given, Chipped   Pulmonary Current Smoker,    breath sounds clear to auscultation       Cardiovascular (-) anginanegative cardio ROS   Rhythm:Regular Rate:Normal     Neuro/Psych Anxiety Depression negative neurological ROS     GI/Hepatic Neg liver ROS, GERD  Medicated and Controlled,H/o colon cancer   Endo/Other  negative endocrine ROS  Renal/GU negative Renal ROS     Musculoskeletal  (+) Arthritis , Osteoarthritis,    Abdominal   Peds  Hematology negative hematology ROS (+)   Anesthesia Other Findings H/o breast cancer  Reproductive/Obstetrics                            Anesthesia Physical Anesthesia Plan  ASA: II  Anesthesia Plan: Spinal   Post-op Pain Management:    Induction:   PONV Risk Score and Plan: 1 and Ondansetron and Dexamethasone  Airway Management Planned: Natural Airway and Simple Face Mask  Additional Equipment:   Intra-op Plan:   Post-operative Plan:   Informed Consent: I have reviewed the patients History and Physical, chart, labs and discussed the procedure including the risks, benefits and alternatives for the proposed anesthesia with the patient or authorized representative who has indicated his/her understanding and acceptance.     Dental advisory given  Plan Discussed with: CRNA and Surgeon  Anesthesia Plan Comments:        Anesthesia Quick Evaluation

## 2018-11-01 ENCOUNTER — Inpatient Hospital Stay (HOSPITAL_COMMUNITY): Payer: Medicare Other

## 2018-11-01 ENCOUNTER — Inpatient Hospital Stay (HOSPITAL_COMMUNITY)
Admission: RE | Admit: 2018-11-01 | Discharge: 2018-11-02 | DRG: 470 | Disposition: A | Discharge status: Home / Self Care | Payer: Medicare Other | Attending: Orthopedic Surgery | Admitting: Orthopedic Surgery

## 2018-11-01 ENCOUNTER — Encounter (HOSPITAL_COMMUNITY): Payer: Self-pay | Admitting: General Practice

## 2018-11-01 ENCOUNTER — Encounter (HOSPITAL_COMMUNITY)
Admission: RE | Disposition: A | Discharge status: Home / Self Care | Payer: Self-pay | Source: Home / Self Care | Attending: Orthopedic Surgery

## 2018-11-01 ENCOUNTER — Other Ambulatory Visit: Payer: Self-pay

## 2018-11-01 ENCOUNTER — Inpatient Hospital Stay (HOSPITAL_COMMUNITY): Payer: Medicare Other | Admitting: Anesthesiology

## 2018-11-01 ENCOUNTER — Inpatient Hospital Stay (HOSPITAL_COMMUNITY): Payer: Medicare Other | Admitting: Physician Assistant

## 2018-11-01 DIAGNOSIS — Z888 Allergy status to other drugs, medicaments and biological substances status: Secondary | ICD-10-CM | POA: Diagnosis not present

## 2018-11-01 DIAGNOSIS — Z79899 Other long term (current) drug therapy: Secondary | ICD-10-CM

## 2018-11-01 DIAGNOSIS — R7303 Prediabetes: Secondary | ICD-10-CM | POA: Diagnosis not present

## 2018-11-01 DIAGNOSIS — Z96642 Presence of left artificial hip joint: Secondary | ICD-10-CM | POA: Diagnosis not present

## 2018-11-01 DIAGNOSIS — Z419 Encounter for procedure for purposes other than remedying health state, unspecified: Secondary | ICD-10-CM

## 2018-11-01 DIAGNOSIS — Z915 Personal history of self-harm: Secondary | ICD-10-CM

## 2018-11-01 DIAGNOSIS — Z85038 Personal history of other malignant neoplasm of large intestine: Secondary | ICD-10-CM

## 2018-11-01 DIAGNOSIS — Z96649 Presence of unspecified artificial hip joint: Secondary | ICD-10-CM

## 2018-11-01 DIAGNOSIS — Z853 Personal history of malignant neoplasm of breast: Secondary | ICD-10-CM

## 2018-11-01 DIAGNOSIS — F17210 Nicotine dependence, cigarettes, uncomplicated: Secondary | ICD-10-CM | POA: Diagnosis present

## 2018-11-01 DIAGNOSIS — M1612 Unilateral primary osteoarthritis, left hip: Principal | ICD-10-CM | POA: Diagnosis present

## 2018-11-01 DIAGNOSIS — Z885 Allergy status to narcotic agent status: Secondary | ICD-10-CM

## 2018-11-01 DIAGNOSIS — F329 Major depressive disorder, single episode, unspecified: Secondary | ICD-10-CM | POA: Diagnosis not present

## 2018-11-01 DIAGNOSIS — Z886 Allergy status to analgesic agent status: Secondary | ICD-10-CM | POA: Diagnosis not present

## 2018-11-01 DIAGNOSIS — M25752 Osteophyte, left hip: Secondary | ICD-10-CM | POA: Diagnosis present

## 2018-11-01 DIAGNOSIS — Z471 Aftercare following joint replacement surgery: Secondary | ICD-10-CM | POA: Diagnosis not present

## 2018-11-01 DIAGNOSIS — F4310 Post-traumatic stress disorder, unspecified: Secondary | ICD-10-CM | POA: Diagnosis not present

## 2018-11-01 DIAGNOSIS — K219 Gastro-esophageal reflux disease without esophagitis: Secondary | ICD-10-CM | POA: Diagnosis not present

## 2018-11-01 DIAGNOSIS — M169 Osteoarthritis of hip, unspecified: Secondary | ICD-10-CM | POA: Diagnosis present

## 2018-11-01 HISTORY — PX: TOTAL HIP ARTHROPLASTY: SHX124

## 2018-11-01 LAB — TYPE AND SCREEN
ABO/RH(D): B POS
Antibody Screen: NEGATIVE

## 2018-11-01 SURGERY — ARTHROPLASTY, HIP, TOTAL, ANTERIOR APPROACH
Anesthesia: Spinal | Laterality: Left

## 2018-11-01 MED ORDER — POLYETHYLENE GLYCOL 3350 17 G PO PACK: 17.0000 g | PACK | Freq: Every day | ORAL | Status: DC | PRN

## 2018-11-01 MED ORDER — ONDANSETRON HCL 4 MG/2ML IJ SOLN: 4.0000 mg | Freq: Four times a day (QID) | INTRAMUSCULAR | Status: DC | PRN

## 2018-11-01 MED ORDER — BUPIVACAINE-EPINEPHRINE (PF) 0.25% -1:200000 IJ SOLN
INTRAMUSCULAR | Status: AC
  Filled 2018-11-01: qty 30

## 2018-11-01 MED ORDER — LACTATED RINGERS IV SOLN
INTRAVENOUS | Status: DC
  Administered 2018-11-01 (×2): via INTRAVENOUS

## 2018-11-01 MED ORDER — RIVAROXABAN 10 MG PO TABS
10.0000 mg | ORAL_TABLET | Freq: Every day | ORAL | Status: DC
  Administered 2018-11-02: 10 mg via ORAL
  Filled 2018-11-01: qty 1

## 2018-11-01 MED ORDER — BISACODYL 10 MG RE SUPP: 10.0000 mg | Freq: Every day | RECTAL | Status: DC | PRN

## 2018-11-01 MED ORDER — HYDROMORPHONE HCL 1 MG/ML IJ SOLN
0.5000 mg | INTRAMUSCULAR | Status: DC | PRN
  Administered 2018-11-02 (×2): 0.5 mg via INTRAVENOUS
  Filled 2018-11-01 (×2): qty 0.5

## 2018-11-01 MED ORDER — METHOCARBAMOL 500 MG IVPB - SIMPLE MED
INTRAVENOUS | Status: AC
  Filled 2018-11-01: qty 50

## 2018-11-01 MED ORDER — DEXAMETHASONE SODIUM PHOSPHATE 10 MG/ML IJ SOLN
INTRAMUSCULAR | Status: AC
  Filled 2018-11-01: qty 1

## 2018-11-01 MED ORDER — PHENOL 1.4 % MT LIQD: 1.0000 | OROMUCOSAL | Status: DC | PRN

## 2018-11-01 MED ORDER — FENTANYL CITRATE (PF) 100 MCG/2ML IJ SOLN
INTRAMUSCULAR | Status: AC
  Filled 2018-11-01: qty 2

## 2018-11-01 MED ORDER — TRANEXAMIC ACID-NACL 1000-0.7 MG/100ML-% IV SOLN
1000.0000 mg | Freq: Once | INTRAVENOUS | Status: AC
  Administered 2018-11-01: 1000 mg via INTRAVENOUS
  Filled 2018-11-01: qty 100

## 2018-11-01 MED ORDER — 0.9 % SODIUM CHLORIDE (POUR BTL) OPTIME
TOPICAL | Status: DC | PRN
  Administered 2018-11-01: 1000 mL

## 2018-11-01 MED ORDER — CEFAZOLIN SODIUM-DEXTROSE 2-4 GM/100ML-% IV SOLN
2.0000 g | INTRAVENOUS | Status: AC
  Administered 2018-11-01: 2 g via INTRAVENOUS
  Filled 2018-11-01: qty 100

## 2018-11-01 MED ORDER — PROMETHAZINE HCL 25 MG/ML IJ SOLN: 6.2500 mg | INTRAMUSCULAR | Status: DC | PRN

## 2018-11-01 MED ORDER — MUPIROCIN 2 % EX OINT
TOPICAL_OINTMENT | Freq: Two times a day (BID) | CUTANEOUS | Status: DC
  Administered 2018-11-01: 1 via NASAL
  Administered 2018-11-01 – 2018-11-02 (×2): via NASAL
  Filled 2018-11-01: qty 22

## 2018-11-01 MED ORDER — SODIUM CHLORIDE 0.9 % IV SOLN
INTRAVENOUS | Status: DC
  Administered 2018-11-01: 16:00:00 via INTRAVENOUS

## 2018-11-01 MED ORDER — MIDAZOLAM HCL 2 MG/2ML IJ SOLN: 0.5000 mg | Freq: Once | INTRAMUSCULAR | Status: DC | PRN

## 2018-11-01 MED ORDER — HYDROMORPHONE HCL 1 MG/ML IJ SOLN
0.2500 mg | INTRAMUSCULAR | Status: DC | PRN
  Administered 2018-11-01: 0.25 mg via INTRAVENOUS
  Administered 2018-11-01 (×3): 0.5 mg via INTRAVENOUS

## 2018-11-01 MED ORDER — PROPOFOL 500 MG/50ML IV EMUL
INTRAVENOUS | Status: DC | PRN
  Administered 2018-11-01: 100 ug/kg/min via INTRAVENOUS

## 2018-11-01 MED ORDER — BUSPIRONE HCL 5 MG PO TABS
15.0000 mg | ORAL_TABLET | Freq: Every day | ORAL | Status: DC
  Administered 2018-11-02: 15 mg via ORAL
  Filled 2018-11-01: qty 3

## 2018-11-01 MED ORDER — FLEET ENEMA 7-19 GM/118ML RE ENEM: 1.0000 | ENEMA | Freq: Once | RECTAL | Status: DC | PRN

## 2018-11-01 MED ORDER — CEFAZOLIN SODIUM-DEXTROSE 2-4 GM/100ML-% IV SOLN
2.0000 g | Freq: Four times a day (QID) | INTRAVENOUS | Status: AC
  Administered 2018-11-01 (×2): 2 g via INTRAVENOUS
  Filled 2018-11-01 (×2): qty 100

## 2018-11-01 MED ORDER — ONDANSETRON HCL 4 MG/2ML IJ SOLN
INTRAMUSCULAR | Status: DC | PRN
  Administered 2018-11-01: 4 mg via INTRAVENOUS

## 2018-11-01 MED ORDER — DOCUSATE SODIUM 100 MG PO CAPS
100.0000 mg | ORAL_CAPSULE | Freq: Two times a day (BID) | ORAL | Status: DC
  Administered 2018-11-01 – 2018-11-02 (×2): 100 mg via ORAL
  Filled 2018-11-01 (×2): qty 1

## 2018-11-01 MED ORDER — ESCITALOPRAM OXALATE 10 MG PO TABS
10.0000 mg | ORAL_TABLET | Freq: Every day | ORAL | Status: DC
  Administered 2018-11-02: 10 mg via ORAL
  Filled 2018-11-01: qty 1

## 2018-11-01 MED ORDER — BUPIVACAINE IN DEXTROSE 0.75-8.25 % IT SOLN
INTRATHECAL | Status: DC | PRN
  Administered 2018-11-01: 13 mg via INTRATHECAL

## 2018-11-01 MED ORDER — ONDANSETRON HCL 4 MG/2ML IJ SOLN
INTRAMUSCULAR | Status: AC
  Filled 2018-11-01: qty 2

## 2018-11-01 MED ORDER — FENTANYL CITRATE (PF) 100 MCG/2ML IJ SOLN
INTRAMUSCULAR | Status: DC | PRN
  Administered 2018-11-01 (×2): 50 ug via INTRAVENOUS

## 2018-11-01 MED ORDER — DEXAMETHASONE SODIUM PHOSPHATE 10 MG/ML IJ SOLN
10.0000 mg | Freq: Once | INTRAMUSCULAR | Status: AC
  Administered 2018-11-02: 10 mg via INTRAVENOUS
  Filled 2018-11-01: qty 1

## 2018-11-01 MED ORDER — MEPERIDINE HCL 50 MG/ML IJ SOLN: 6.2500 mg | INTRAMUSCULAR | Status: DC | PRN

## 2018-11-01 MED ORDER — DEXAMETHASONE SODIUM PHOSPHATE 10 MG/ML IJ SOLN
8.0000 mg | Freq: Once | INTRAMUSCULAR | Status: AC
  Administered 2018-11-01: 8 mg via INTRAVENOUS

## 2018-11-01 MED ORDER — ONDANSETRON HCL 4 MG PO TABS: 4.0000 mg | ORAL_TABLET | Freq: Four times a day (QID) | ORAL | Status: DC | PRN

## 2018-11-01 MED ORDER — PROPOFOL 10 MG/ML IV BOLUS
INTRAVENOUS | Status: AC
  Filled 2018-11-01: qty 40

## 2018-11-01 MED ORDER — STERILE WATER FOR IRRIGATION IR SOLN
Status: DC | PRN
  Administered 2018-11-01: 2000 mL

## 2018-11-01 MED ORDER — HYDROMORPHONE HCL 2 MG PO TABS
4.0000 mg | ORAL_TABLET | ORAL | Status: DC | PRN
  Administered 2018-11-01 – 2018-11-02 (×5): 4 mg via ORAL
  Filled 2018-11-01 (×5): qty 2

## 2018-11-01 MED ORDER — PROPOFOL 10 MG/ML IV BOLUS
INTRAVENOUS | Status: AC
  Filled 2018-11-01: qty 20

## 2018-11-01 MED ORDER — TRANEXAMIC ACID-NACL 1000-0.7 MG/100ML-% IV SOLN
1000.0000 mg | INTRAVENOUS | Status: AC
  Administered 2018-11-01: 1000 mg via INTRAVENOUS
  Filled 2018-11-01: qty 100

## 2018-11-01 MED ORDER — MENTHOL 3 MG MT LOZG: 1.0000 | LOZENGE | OROMUCOSAL | Status: DC | PRN

## 2018-11-01 MED ORDER — HYDROMORPHONE HCL 1 MG/ML IJ SOLN
INTRAMUSCULAR | Status: AC
  Filled 2018-11-01: qty 2

## 2018-11-01 MED ORDER — MIDAZOLAM HCL 5 MG/5ML IJ SOLN
INTRAMUSCULAR | Status: DC | PRN
  Administered 2018-11-01 (×2): 1 mg via INTRAVENOUS

## 2018-11-01 MED ORDER — BUPIVACAINE-EPINEPHRINE (PF) 0.25% -1:200000 IJ SOLN
INTRAMUSCULAR | Status: DC | PRN
  Administered 2018-11-01: 30 mL via PERINEURAL

## 2018-11-01 MED ORDER — DIPHENHYDRAMINE HCL 12.5 MG/5ML PO ELIX
12.5000 mg | ORAL_SOLUTION | ORAL | Status: DC | PRN
  Administered 2018-11-02: 25 mg via ORAL
  Filled 2018-11-01: qty 10

## 2018-11-01 MED ORDER — ACETAMINOPHEN 10 MG/ML IV SOLN
1000.0000 mg | Freq: Four times a day (QID) | INTRAVENOUS | Status: DC
  Administered 2018-11-01: 1000 mg via INTRAVENOUS
  Filled 2018-11-01: qty 100

## 2018-11-01 MED ORDER — METOCLOPRAMIDE HCL 5 MG/ML IJ SOLN: 5.0000 mg | Freq: Three times a day (TID) | INTRAMUSCULAR | Status: DC | PRN

## 2018-11-01 MED ORDER — METHOCARBAMOL 500 MG IVPB - SIMPLE MED
500.0000 mg | Freq: Four times a day (QID) | INTRAVENOUS | Status: DC | PRN
  Administered 2018-11-01: 500 mg via INTRAVENOUS
  Filled 2018-11-01: qty 50

## 2018-11-01 MED ORDER — MIDAZOLAM HCL 2 MG/2ML IJ SOLN
INTRAMUSCULAR | Status: AC
  Filled 2018-11-01: qty 2

## 2018-11-01 MED ORDER — METOCLOPRAMIDE HCL 5 MG PO TABS: 5.0000 mg | ORAL_TABLET | Freq: Three times a day (TID) | ORAL | Status: DC | PRN

## 2018-11-01 MED ORDER — OMEPRAZOLE 20 MG PO CPDR: 20.0000 mg | DELAYED_RELEASE_CAPSULE | Freq: Every day | ORAL | Status: DC | PRN

## 2018-11-01 MED ORDER — CHLORHEXIDINE GLUCONATE 4 % EX LIQD: 60.0000 mL | Freq: Once | CUTANEOUS | Status: DC

## 2018-11-01 MED ORDER — METHOCARBAMOL 500 MG PO TABS
500.0000 mg | ORAL_TABLET | Freq: Four times a day (QID) | ORAL | Status: DC | PRN
  Administered 2018-11-01 – 2018-11-02 (×2): 500 mg via ORAL
  Filled 2018-11-01 (×2): qty 1

## 2018-11-01 MED ORDER — HYDROMORPHONE HCL 2 MG PO TABS
2.0000 mg | ORAL_TABLET | ORAL | Status: DC | PRN
  Administered 2018-11-01: 2 mg via ORAL
  Filled 2018-11-01: qty 1

## 2018-11-01 SURGICAL SUPPLY — 43 items
BAG DECANTER FOR FLEXI CONT (MISCELLANEOUS) ×3 IMPLANT
BAG SPEC THK2 15X12 ZIP CLS (MISCELLANEOUS)
BAG ZIPLOCK 12X15 (MISCELLANEOUS) IMPLANT
BLADE SAG 18X100X1.27 (BLADE) ×3 IMPLANT
BLADE SURG SZ10 CARB STEEL (BLADE) ×6 IMPLANT
CLOSURE WOUND 1/2 X4 (GAUZE/BANDAGES/DRESSINGS) ×1
COVER PERINEAL POST (MISCELLANEOUS) ×3 IMPLANT
COVER SURGICAL LIGHT HANDLE (MISCELLANEOUS) ×3 IMPLANT
COVER WAND RF STERILE (DRAPES) IMPLANT
CUP ACETBLR 48 OD SECTOR II (Hips) ×2 IMPLANT
DECANTER SPIKE VIAL GLASS SM (MISCELLANEOUS) ×3 IMPLANT
DRAPE STERI IOBAN 125X83 (DRAPES) ×3 IMPLANT
DRAPE U-SHAPE 47X51 STRL (DRAPES) ×6 IMPLANT
DRSG ADAPTIC 3X8 NADH LF (GAUZE/BANDAGES/DRESSINGS) ×3 IMPLANT
DRSG MEPILEX BORDER 4X4 (GAUZE/BANDAGES/DRESSINGS) ×3 IMPLANT
DRSG MEPILEX BORDER 4X8 (GAUZE/BANDAGES/DRESSINGS) ×3 IMPLANT
DURAPREP 26ML APPLICATOR (WOUND CARE) ×3 IMPLANT
ELECT REM PT RETURN 15FT ADLT (MISCELLANEOUS) ×3 IMPLANT
EVACUATOR 1/8 PVC DRAIN (DRAIN) ×3 IMPLANT
GLOVE BIO SURGEON STRL SZ7 (GLOVE) ×3 IMPLANT
GLOVE BIO SURGEON STRL SZ8 (GLOVE) ×3 IMPLANT
GLOVE BIOGEL PI IND STRL 7.0 (GLOVE) ×1 IMPLANT
GLOVE BIOGEL PI IND STRL 8 (GLOVE) ×1 IMPLANT
GLOVE BIOGEL PI INDICATOR 7.0 (GLOVE) ×2
GLOVE BIOGEL PI INDICATOR 8 (GLOVE) ×2
GOWN STRL REUS W/TWL LRG LVL3 (GOWN DISPOSABLE) ×6 IMPLANT
HEAD CERAMIC DELTA 28M 12/14P5 (Head) ×2 IMPLANT
HOLDER FOLEY CATH W/STRAP (MISCELLANEOUS) ×3 IMPLANT
LINER MARATHON 28 48 (Hips) ×2 IMPLANT
MANIFOLD NEPTUNE II (INSTRUMENTS) ×3 IMPLANT
PACK ANTERIOR HIP CUSTOM (KITS) ×3 IMPLANT
STEM FEM SZ3 STD ACTIS (Stem) ×2 IMPLANT
STRIP CLOSURE SKIN 1/2X4 (GAUZE/BANDAGES/DRESSINGS) ×2 IMPLANT
SUT ETHIBOND NAB CT1 #1 30IN (SUTURE) ×3 IMPLANT
SUT MNCRL AB 4-0 PS2 18 (SUTURE) ×3 IMPLANT
SUT STRATAFIX 0 PDS 27 VIOLET (SUTURE) ×3
SUT VIC AB 2-0 CT1 27 (SUTURE) ×6
SUT VIC AB 2-0 CT1 TAPERPNT 27 (SUTURE) ×2 IMPLANT
SUTURE STRATFX 0 PDS 27 VIOLET (SUTURE) ×1 IMPLANT
SYR 50ML LL SCALE MARK (SYRINGE) IMPLANT
TAPE STRIPS DRAPE STRL (GAUZE/BANDAGES/DRESSINGS) ×2 IMPLANT
TRAY FOLEY MTR SLVR 16FR STAT (SET/KITS/TRAYS/PACK) ×3 IMPLANT
YANKAUER SUCT BULB TIP 10FT TU (MISCELLANEOUS) ×3 IMPLANT

## 2018-11-01 NOTE — Interval H&P Note (Signed)
History and Physical Interval Note:  11/01/2018 7:20 AM  Kayla Chang  has presented today for surgery, with the diagnosis of left hip osteoarthritis  The various methods of treatment have been discussed with the patient and family. After consideration of risks, benefits and other options for treatment, the patient has consented to  Procedure(s) with comments: [place] (Left) - 135min as a surgical intervention .  The patient's history has been reviewed, patient examined, no change in status, stable for surgery.  I have reviewed the patient's chart and labs.  Questions were answered to the patient's satisfaction.     Pilar Plate Coron Rossano

## 2018-11-01 NOTE — Op Note (Signed)
OPERATIVE REPORT- TOTAL HIP ARTHROPLASTY   PREOPERATIVE DIAGNOSIS: Osteoarthritis of the Left hip.   POSTOPERATIVE DIAGNOSIS: Osteoarthritis of the Left  hip.   PROCEDURE: Left total hip arthroplasty, anterior approach.   SURGEON: Gaynelle Arabian, MD   ASSISTANT: Theresa Duty, PA-C  ANESTHESIA:  Spinal  ESTIMATED BLOOD LOSS:-150 mL    DRAINS: Hemovac x1.   COMPLICATIONS: None   CONDITION: PACU - hemodynamically stable.   BRIEF CLINICAL NOTE: Kayla Chang is a 58 y.o. female who has advanced end-  stage arthritis of their Left  hip with progressively worsening pain and  dysfunction.The patient has failed nonoperative management and presents for  total hip arthroplasty.   PROCEDURE IN DETAIL: After successful administration of spinal  anesthetic, the traction boots for the [facility] bed were placed on both  feet and the patient was placed onto the [facility] bed, boots placed into the leg  holders. The Left hip was then isolated from the perineum with plastic  drapes and prepped and draped in the usual sterile fashion. ASIS and  greater trochanter were marked and a oblique incision was made, starting  at about 1 cm lateral and 2 cm distal to the ASIS and coursing towards  the anterior cortex of the femur. The skin was cut with a 10 blade  through subcutaneous tissue to the level of the fascia overlying the  tensor fascia lata muscle. The fascia was then incised in line with the  incision at the junction of the anterior third and posterior 2/3rd. The  muscle was teased off the fascia and then the interval between the TFL  and the rectus was developed. The Hohmann retractor was then placed at  the top of the femoral neck over the capsule. The vessels overlying the  capsule were cauterized and the fat on top of the capsule was removed.  A Hohmann retractor was then placed anterior underneath the rectus  femoris to give exposure to the entire anterior capsule. A T-shaped   capsulotomy was performed. The edges were tagged and the femoral head  was identified.       Osteophytes are removed off the superior acetabulum.  The femoral neck was then cut in situ with an oscillating saw. Traction  was then applied to the left lower extremity utilizing the [facility]  traction. The femoral head was then removed. Retractors were placed  around the acetabulum and then circumferential removal of the labrum was  performed. Osteophytes were also removed. Reaming starts at 45 mm to  medialize and  Increased in 2 mm increments to 47 mm. We reamed in  approximately 40 degrees of abduction, 20 degrees anteversion. A 48 mm  pinnacle acetabular shell was then impacted in anatomic position under  fluoroscopic guidance with excellent purchase. We did not need to place  any additional dome screws. A 28 mm neutral + 4 marathon liner was then  placed into the acetabular shell.       The femoral lift was then placed along the lateral aspect of the femur  just distal to the vastus ridge. The leg was  externally rotated and capsule  was stripped off the inferior aspect of the femoral neck down to the  level of the lesser trochanter, this was done with electrocautery. The femur was lifted after this was performed. The  leg was then placed in an extended and adducted position essentially delivering the femur. We also removed the capsule superiorly and the piriformis from the piriformis  fossa to gain excellent exposure of the  proximal femur. Rongeur was used to remove some cancellous bone to get  into the lateral portion of the proximal femur for placement of the  initial starter reamer. The starter broaches was placed  the starter broach  and was shown to go down the center of the canal. Broaching  with the Actis system was then performed starting at size 0  coursing  Up to size 3. A size 3 had excellent torsional and rotational  and axial stability. The trial standard offset neck was then  placed  with a 28 + 5 trial head. The hip was then reduced. We confirmed that  the stem was in the canal both on AP and lateral x-rays. It also has excellent sizing. The hip was reduced with outstanding stability through full extension and full external rotation.. AP pelvis was taken and the leg lengths were measured and found to be equal. Hip was then dislocated again and the femoral head and neck removed. The  femoral broach was removed. Size 3 Actis stem with a standard offset  neck was then impacted into the femur following native anteversion. Has  excellent purchase in the canal. Excellent torsional and rotational and  axial stability. It is confirmed to be in the canal on AP and lateral  fluoroscopic views. The 28 + 5 ceramic head was placed and the hip  reduced with outstanding stability. Again AP pelvis was taken and it  confirmed that the leg lengths were equal. The wound was then copiously  irrigated with saline solution and the capsule reattached and repaired  with Ethibond suture. 30 ml of .25% Bupivicaine was  injected into the capsule and into the edge of the tensor fascia lata as well as subcutaneous tissue. The fascia overlying the tensor fascia lata was then closed with a running #1 V-Loc. Subcu was closed with interrupted 2-0 Vicryl and subcuticular running 4-0 Monocryl. Incision was cleaned  and dried. Steri-Strips and a bulky sterile dressing applied. Hemovac  drain was hooked to suction and then the patient was awakened and transported to  recovery in stable condition.        Please note that a surgical assistant was a medical necessity for this procedure to perform it in a safe and expeditious manner. Assistant was necessary to provide appropriate retraction of vital neurovascular structures and to prevent femoral fracture and allow for anatomic placement of the prosthesis.  Gaynelle Arabian, M.D.

## 2018-11-01 NOTE — Anesthesia Postprocedure Evaluation (Signed)
Anesthesia Post Note  Patient: Kayla Chang  Procedure(s) Performed: TOTAL HIP ARTHROPLASTY ANTERIOR APPROACH (Left )     Patient location during evaluation: PACU Anesthesia Type: Spinal Level of consciousness: awake and alert, oriented and patient cooperative Pain management: pain level controlled Vital Signs Assessment: post-procedure vital signs reviewed and stable Respiratory status: spontaneous breathing, nonlabored ventilation and respiratory function stable Cardiovascular status: blood pressure returned to baseline and stable Postop Assessment: no apparent nausea or vomiting and spinal receding Anesthetic complications: no    Last Vitals:  Vitals:   11/01/18 1336 11/01/18 1514  BP: 124/77 (!) 139/96  Pulse: 65 67  Resp: 15 15  Temp: 36.4 C 36.6 C  SpO2: 95% 96%    Last Pain:  Vitals:   11/01/18 1731  TempSrc:   PainSc: 3                  Jarmarcus Wambold,E. Tina Temme

## 2018-11-01 NOTE — Evaluation (Signed)
Physical Therapy Evaluation Patient Details Name: Kayla Chang MRN: [id number] DOB: 1961-06-14 Today's Date: 11/01/2018   History of Present Illness  Pt s/p L THR   Clinical Impression  Pt s/p L THR and presents with decreased L LE strength/ROM and post op pain limiting functional mobility. Pt should progress to dc home with family assist.    Follow Up Recommendations Follow surgeon's recommendation for DC plan and follow-up therapies    Equipment Recommendations  Rolling walker with 5" wheels    Recommendations for Other Services       Precautions / Restrictions Precautions Precautions: Fall Restrictions Weight Bearing Restrictions: No Other Position/Activity Restrictions: WBAT      Mobility  Bed Mobility Overal bed mobility: Needs Assistance Bed Mobility: Supine to Sit     Supine to sit: Min assist;Mod assist     General bed mobility comments: cues for sequence and use of R LE to self assist; physical assist to manage L LE and to bring trunk to upright  Transfers Overall transfer level: Needs assistance Equipment used: Rolling walker (2 wheeled) Transfers: Sit to/from Stand Sit to Stand: Min assist         General transfer comment: cues for LE management and use of UEs to self assist  Ambulation/Gait Ambulation/Gait assistance: Min assist Gait Distance (Feet): 60 Feet Assistive device: Rolling walker (2 wheeled) Gait Pattern/deviations: Step-to pattern;Decreased step length - right;Decreased step length - left;Shuffle;Trunk flexed Gait velocity: decr   General Gait Details: cues for sequence, posture and position from [employer]            Wheelchair Mobility    Modified Rankin (Stroke Patients Only)       Balance Overall balance assessment: Mild deficits observed, not formally tested                                           Pertinent Vitals/Pain Pain Assessment: 0-10 Pain Score: 6  Pain Location: L hip Pain  Descriptors / Indicators: Aching;Sore Pain Intervention(s): Limited activity within patient's tolerance;Monitored during session;Premedicated before session;Ice applied    Home Living Family/patient expects to be discharged to:: Private residence Living Arrangements: Alone Available Help at Discharge: Family Type of Home: House Home Access: Stairs to enter   Technical brewer of Steps: 1 Home Layout: Two level Home Equipment: [place] - single point Additional Comments: Pt will be staying with son - home information applies to son's home    Prior Function Level of Independence: Independent               Hand Dominance        Extremity/Trunk Assessment   Upper Extremity Assessment Upper Extremity Assessment: Overall WFL for tasks assessed    Lower Extremity Assessment Lower Extremity Assessment: LLE deficits/detail    Cervical / Trunk Assessment Cervical / Trunk Assessment: Normal  Communication   Communication: No difficulties  Cognition Arousal/Alertness: Awake/alert Behavior During Therapy: WFL for tasks assessed/performed Overall Cognitive Status: Within Functional Limits for tasks assessed                                        General Comments      Exercises Total Joint Exercises Ankle Circles/Pumps: AROM;Both;15 reps;Supine   Assessment/Plan    PT Assessment Patient needs continued PT  services  PT Problem List Decreased strength;Decreased range of motion;Decreased activity tolerance;Decreased mobility;Decreased knowledge of use of DME;Pain       PT Treatment Interventions DME instruction;Gait training;Stair training;Functional mobility training;Therapeutic activities;Therapeutic exercise;Patient/family education    PT Goals (Current goals can be found in the Care Plan section)  Acute Rehab PT Goals Patient Stated Goal: Regain IND PT Goal Formulation: With patient Time For Goal Achievement: 11/08/18 Potential to Achieve Goals:  Good    Frequency 7X/week   Barriers to discharge        Co-evaluation               AM-PAC PT "6 Clicks" Mobility  Outcome Measure Help needed turning from your back to your side while in a flat bed without using bedrails?: A Lot Help needed moving from lying on your back to sitting on the side of a flat bed without using bedrails?: A Lot Help needed moving to and from a bed to a chair (including a wheelchair)?: A Lot Help needed standing up from a chair using your arms (e.g., wheelchair or bedside chair)?: A Little Help needed to walk in hospital room?: A Little Help needed climbing 3-5 steps with a railing? : A Lot 6 Click Score: 14    End of Session Equipment Utilized During Treatment: Gait belt Activity Tolerance: Patient tolerated treatment well Patient left: in chair;with call bell/phone within reach;with family/visitor present Nurse Communication: Mobility status PT Visit Diagnosis: Difficulty in walking, not elsewhere classified (R26.2)    Time: 1442-1510 PT Time Calculation (min) (ACUTE ONLY): 28 min   Charges:   PT Evaluation $PT Eval Low Complexity: 1 Low PT Treatments $Gait Training: 8-22 mins        [place] Pager [phone] Office [phone]   Vishaal Strollo 11/01/2018, 3:23 PM

## 2018-11-01 NOTE — Transfer of Care (Signed)
Immediate Anesthesia Transfer of Care Note  Patient: Kayla Chang  Procedure(s) Performed: TOTAL HIP ARTHROPLASTY ANTERIOR APPROACH (Left )  Patient Location: PACU  Anesthesia Type:Spinal  Level of Consciousness: awake, alert , oriented and patient cooperative  Airway & Oxygen Therapy: Patient Spontanous Breathing and Patient connected to face mask oxygen  Post-op Assessment: Report given to RN and Post -op Vital signs reviewed and stable  Post vital signs: Reviewed and stable  Last Vitals:  Vitals Value Taken Time  BP 124/73 11/01/2018 10:15 AM  Temp    Pulse 54 11/01/2018 10:16 AM  Resp 13 11/01/2018 10:16 AM  SpO2 100 % 11/01/2018 10:16 AM  Vitals shown include unvalidated device data.  Last Pain:  Vitals:   11/01/18 0630  TempSrc: Oral  PainSc: 5          Complications: No apparent anesthesia complications

## 2018-11-01 NOTE — Discharge Instructions (Addendum)
°Dr. Frank Aluisio °Total Joint Specialist °[facility] °[address] °[place], [place] [address] °[phone] ° °ANTERIOR APPROACH TOTAL HIP REPLACEMENT POSTOPERATIVE DIRECTIONS ° ° °Hip Rehabilitation, Guidelines Following Surgery  °The results of a hip operation are greatly improved after range of motion and muscle strengthening exercises. Follow all safety measures which are given to protect your hip. If any of these exercises cause increased pain or swelling in your joint, decrease the amount until you are comfortable again. Then slowly increase the exercises. Call your caregiver if you have problems or questions.  ° °HOME CARE INSTRUCTIONS  °• Remove items at home which could result in a fall. This includes throw rugs or furniture in walking pathways.  °· ICE to the affected hip every three hours for 30 minutes at a time and then as needed for pain and swelling.  Continue to use ice on the hip for pain and swelling from surgery. You may notice swelling that will progress down to the foot and ankle.  This is normal after surgery.  Elevate the leg when you are not up walking on it.   °· Continue to use the breathing machine which will help keep your temperature down.  It is common for your temperature to cycle up and down following surgery, especially at night when you are not up moving around and exerting yourself.  The breathing machine keeps your lungs expanded and your temperature down. ° °DIET °You may resume your previous home diet once your are discharged from the hospital. ° °DRESSING / WOUND CARE / SHOWERING °You may change your dressing 3-5 days after surgery.  Then change the dressing every day with sterile gauze.  Please use good hand washing techniques before changing the dressing.  Do not use any lotions or creams on the incision until instructed by your surgeon. °You may start showering once you are discharged home but do not submerge the incision under water. Just pat the  incision dry and apply a dry gauze dressing on daily. °Change the surgical dressing daily and reapply a dry dressing each time. ° °ACTIVITY °Walk with your walker as instructed. °Use walker as long as suggested by your caregivers. °Avoid periods of inactivity such as sitting longer than an hour when not asleep. This helps prevent blood clots.  °You may resume a sexual relationship in one month or when given the OK by your doctor.  °You may return to work once you are cleared by your doctor.  °Do not drive a car for 6 weeks or until released by you surgeon.  °Do not drive while taking narcotics. ° °WEIGHT BEARING °Weight bearing as tolerated with assist device (walker, cane, etc) as directed, use it as long as suggested by your surgeon or therapist, typically at least 4-6 weeks. ° °POSTOPERATIVE CONSTIPATION PROTOCOL °Constipation - defined medically as fewer than three stools per week and severe constipation as less than one stool per week. ° °One of the most common issues patients have following surgery is constipation.  Even if you have a regular bowel pattern at home, your normal regimen is likely to be disrupted due to multiple reasons following surgery.  Combination of anesthesia, postoperative narcotics, change in appetite and fluid intake all can affect your bowels.  In order to avoid complications following surgery, here are some recommendations in order to help you during your recovery period. ° °Colace (docusate) - Pick up an over-the-counter form of Colace or another stool softener and take twice a day   as long as you are requiring postoperative pain medications.  Take with a full glass of water daily.  If you experience loose stools or diarrhea, hold the colace until you stool forms back up.  If your symptoms do not get better within 1 week or if they get worse, check with your doctor. ° °Dulcolax (bisacodyl) - Pick up over-the-counter and take as directed by the product packaging as needed to assist with  the movement of your bowels.  Take with a full glass of water.  Use this product as needed if not relieved by Colace only.  ° °MiraLax (polyethylene glycol) - Pick up over-the-counter to have on hand.  MiraLax is a solution that will increase the amount of water in your bowels to assist with bowel movements.  Take as directed and can mix with a glass of water, juice, soda, coffee, or tea.  Take if you go more than two days without a movement. °Do not use MiraLax more than once per day. Call your doctor if you are still constipated or irregular after using this medication for 7 days in a row. ° °If you continue to have problems with postoperative constipation, please contact the office for further assistance and recommendations.  If you experience "the worst abdominal pain ever" or develop nausea or vomiting, please contact the office immediatly for further recommendations for treatment. ° °ITCHING ° If you experience itching with your medications, try taking only a single pain pill, or even half a pain pill at a time.  You can also use Benadryl over the counter for itching or also to help with sleep.  ° °TED HOSE STOCKINGS °Wear the elastic stockings on both legs for three weeks following surgery during the day but you may remove then at night for sleeping. ° °MEDICATIONS °See your medication summary on the “After Visit Summary” that the nursing staff will review with you prior to discharge.  You may have some home medications which will be placed on hold until you complete the course of blood thinner medication.  It is important for you to complete the blood thinner medication as prescribed by your surgeon.  Continue your approved medications as instructed at time of discharge. ° °PRECAUTIONS °If you experience chest pain or shortness of breath - call 911 immediately for transfer to the hospital emergency department.  °If you develop a fever greater that 101 F, purulent drainage from wound, increased redness or  drainage from wound, foul odor from the wound/dressing, or calf pain - CONTACT YOUR SURGEON.   °                                                °FOLLOW-UP APPOINTMENTS °Make sure you keep all of your appointments after your operation with your surgeon and caregivers. You should call the office at the above phone number and make an appointment for approximately two weeks after the date of your surgery or on the date instructed by your surgeon outlined in the "After Visit Summary". ° °RANGE OF MOTION AND STRENGTHENING EXERCISES  °These exercises are designed to help you keep full movement of your hip joint. Follow your caregiver's or physical therapist's instructions. Perform all exercises about fifteen times, three times per day or as directed. Exercise both hips, even if you have had only one joint replacement. These exercises can be done on   a training (exercise) mat, on the floor, on a table or on a bed. Use whatever works the best and is most comfortable for you. Use music or television while you are exercising so that the exercises are a pleasant break in your day. This will make your life better with the exercises acting as a break in routine you can look forward to.  °• Lying on your back, slowly slide your foot toward your buttocks, raising your knee up off the floor. Then slowly slide your foot back down until your leg is straight again.  °• Lying on your back spread your legs as far apart as you can without causing discomfort.  °• Lying on your side, raise your upper leg and foot straight up from the floor as far as is comfortable. Slowly lower the leg and repeat.  °• Lying on your back, tighten up the muscle in the front of your thigh (quadriceps muscles). You can do this by keeping your leg straight and trying to raise your heel off the floor. This helps strengthen the largest muscle supporting your knee.  °• Lying on your back, tighten up the muscles of your buttocks both with the legs straight and with  the knee bent at a comfortable angle while keeping your heel on the floor.  ° °IF YOU ARE TRANSFERRED TO A SKILLED REHAB FACILITY °If the patient is transferred to a skilled rehab facility following release from the hospital, a list of the current medications will be sent to the facility for the patient to continue.  When discharged from the skilled rehab facility, please have the facility set up the patient's Home Health Physical Therapy prior to being released. Also, the skilled facility will be responsible for providing the patient with their medications at time of release from the facility to include their pain medication, the muscle relaxants, and their blood thinner medication. If the patient is still at the rehab facility at time of the two week follow up appointment, the skilled rehab facility will also need to assist the patient in arranging follow up appointment in our office and any transportation needs. ° °MAKE SURE YOU:  °• Understand these instructions.  °• Get help right away if you are not doing well or get worse.  ° ° °Pick up stool softner and laxative for home use following surgery while on pain medications. °Do not submerge incision under water. °Please use good hand washing techniques while changing dressing each day. °May shower starting three days after surgery. °Please use a clean towel to pat the incision dry following showers. °Continue to use ice for pain and swelling after surgery. °Do not use any lotions or creams on the incision until instructed by your surgeon. ° °Information on my medicine - XARELTO® (Rivaroxaban) ° °Why was Xarelto® prescribed for you? °Xarelto® was prescribed for you to reduce the risk of blood clots forming after orthopedic surgery. The medical term for these abnormal blood clots is venous thromboembolism (VTE). ° °What do you need to know about xarelto® ? °Take your Xarelto® ONCE DAILY at the same time every day. °You may take it either with or without food. ° °If  you have difficulty swallowing the tablet whole, you may crush it and mix in applesauce just prior to taking your dose. ° °Take Xarelto® exactly as prescribed by your doctor and DO NOT stop taking Xarelto® without talking to the doctor who prescribed the medication.  Stopping without other VTE prevention medication to take the place   of Xarelto® may increase your risk of developing a clot. ° °After discharge, you should have regular check-up appointments with your healthcare provider that is prescribing your Xarelto®.   ° °What do you do if you miss a dose? °If you miss a dose, take it as soon as you remember on the same day then continue your regularly scheduled once daily regimen the next day. Do not take two doses of Xarelto® on the same day.  ° °Important Safety Information °A possible side effect of Xarelto® is bleeding. You should call your healthcare provider right away if you experience any of the following: °? Bleeding from an injury or your nose that does not stop. °? Unusual colored urine (red or dark brown) or unusual colored stools (red or black). °? Unusual bruising for unknown reasons. °? A serious fall or if you hit your head (even if there is no bleeding). ° °Some medicines may interact with Xarelto® and might increase your risk of bleeding while on Xarelto®. To help avoid this, consult your healthcare provider or pharmacist prior to using any new prescription or non-prescription medications, including herbals, vitamins, non-steroidal anti-inflammatory drugs (NSAIDs) and supplements. ° °This website has more information on Xarelto®: www.xarelto.com. ° ° ° °

## 2018-11-01 NOTE — Anesthesia Procedure Notes (Signed)
Spinal

## 2018-11-01 NOTE — Care Plan (Signed)
Ortho Bundle Case Management Note  Patient Details  Name: Kayla Chang MRN: [id number] Date of Birth: July 10, 1961                 L THA scheduled 11-01-2018 DCP:  Home with son.  2 story home with 0 ste. MBR is on the main level.   DME:  RW ordered through [place].  Doesn't want/need a 3-in-1 PT:  HEP    DME Arranged:  Walker rolling DME Agency:  [employer]  HH Arranged:  NA HH Agency:  NA  Additional Comments: Please contact me with any questions of if this plan should need to change.  Marianne Sofia, RN,CCM [facility]  [phone] 11/01/2018, 11:21 AM

## 2018-11-01 NOTE — Anesthesia Procedure Notes (Signed)
Spinal  Start time: 11/01/2018 8:33 AM End time: 11/01/2018 8:39 AM Staffing Resident/CRNA: Garrel Ridgel, CRNA Performed: resident/CRNA  Preanesthetic Checklist Completed: patient identified, site marked, surgical consent, pre-op evaluation, timeout performed, IV checked, risks and benefits discussed and monitors and equipment checked Spinal Block Patient position: sitting Prep: Betadine Patient monitoring: cardiac monitor, continuous pulse ox and blood pressure Approach: midline Location: L4-5 Injection technique: single-shot Needle Needle type: Pencan  Needle gauge: 24 G Needle length: 9 cm Needle insertion depth: 5 cm Assessment Sensory level: T8

## 2018-11-02 ENCOUNTER — Encounter (HOSPITAL_COMMUNITY): Payer: Self-pay | Admitting: Orthopedic Surgery

## 2018-11-02 DIAGNOSIS — F17210 Nicotine dependence, cigarettes, uncomplicated: Secondary | ICD-10-CM | POA: Diagnosis not present

## 2018-11-02 DIAGNOSIS — M169 Osteoarthritis of hip, unspecified: Secondary | ICD-10-CM | POA: Diagnosis present

## 2018-11-02 DIAGNOSIS — M25752 Osteophyte, left hip: Secondary | ICD-10-CM | POA: Diagnosis not present

## 2018-11-02 DIAGNOSIS — M1612 Unilateral primary osteoarthritis, left hip: Secondary | ICD-10-CM | POA: Diagnosis not present

## 2018-11-02 DIAGNOSIS — F4310 Post-traumatic stress disorder, unspecified: Secondary | ICD-10-CM | POA: Diagnosis not present

## 2018-11-02 DIAGNOSIS — F329 Major depressive disorder, single episode, unspecified: Secondary | ICD-10-CM | POA: Diagnosis not present

## 2018-11-02 DIAGNOSIS — R7303 Prediabetes: Secondary | ICD-10-CM | POA: Diagnosis not present

## 2018-11-02 LAB — CBC
HCT: 42.4 % (ref 36.0–46.0)
Hemoglobin: 13.2 g/dL (ref 12.0–15.0)
MCH: 32.1 pg (ref 26.0–34.0)
MCHC: 31.1 g/dL (ref 30.0–36.0)
MCV: 103.2 fL — ABNORMAL HIGH (ref 80.0–100.0)
Platelets: 145 10*3/uL — ABNORMAL LOW (ref 150–400)
RBC: 4.11 MIL/uL (ref 3.87–5.11)
RDW: 12.1 % (ref 11.5–15.5)
WBC: 9 10*3/uL (ref 4.0–10.5)
nRBC: 0 % (ref 0.0–0.2)

## 2018-11-02 LAB — BASIC METABOLIC PANEL
Anion gap: 8 (ref 5–15)
BUN: 12 mg/dL (ref 6–20)
CO2: 23 mmol/L (ref 22–32)
Calcium: 9.1 mg/dL (ref 8.9–10.3)
Chloride: 105 mmol/L (ref 98–111)
Creatinine, Ser: 0.74 mg/dL (ref 0.44–1.00)
GFR calc non Af Amer: >60 mL/min (ref >60)
Glucose, Bld: 118 mg/dL — ABNORMAL HIGH (ref 70–99)
Potassium: 4.1 mmol/L (ref 3.5–5.1)
Sodium: 136 mmol/L (ref 135–145)

## 2018-11-02 MED ORDER — RIVAROXABAN 10 MG PO TABS: 10.0000 mg | ORAL_TABLET | Freq: Every day | ORAL | 0 refills | Status: DC

## 2018-11-02 MED ORDER — METHOCARBAMOL 500 MG PO TABS: 500.0000 mg | ORAL_TABLET | Freq: Four times a day (QID) | ORAL | 0 refills | Status: DC | PRN

## 2018-11-02 MED ORDER — HYDROMORPHONE HCL 2 MG PO TABS: 2.0000 mg | ORAL_TABLET | Freq: Four times a day (QID) | ORAL | 0 refills | Status: DC | PRN

## 2018-11-02 NOTE — Progress Notes (Signed)
Physical Therapy Treatment Patient Details Name: Kayla Chang MRN: [id number] DOB: Dec 19, 1960 Today's Date: 11/02/2018    History of Present Illness Pt s/p L THR     PT Comments    Pt motivated and progressing with mobility despite pain.  Pt and son reviewed bed mobility (including move to side ly to sleep), car transfers, stairs and home therex program - written instruction provided.   Follow Up Recommendations  Follow surgeon's recommendation for DC plan and follow-up therapies     Equipment Recommendations  Rolling walker with 5" wheels;3in1 (PT)    Recommendations for Other Services       Precautions / Restrictions Precautions Precautions: Fall Restrictions Weight Bearing Restrictions: No Other Position/Activity Restrictions: WBAT    Mobility  Bed Mobility Overal bed mobility: Needs Assistance Bed Mobility: Supine to Sit     Supine to sit: Min assist;Mod assist     General bed mobility comments: Increased time with cues for sequence and assist to bring bil LE up together  Transfers Overall transfer level: Needs assistance Equipment used: Rolling walker (2 wheeled) Transfers: Sit to/from Stand Sit to Stand: Min guard;Supervision         General transfer comment: cues for LE management and use of UEs to self assist  Ambulation/Gait Ambulation/Gait assistance: Min guard;Supervision Gait Distance (Feet): 75 Feet Assistive device: Rolling walker (2 wheeled) Gait Pattern/deviations: Step-to pattern;Decreased step length - right;Decreased step length - left;Shuffle;Trunk flexed Gait velocity: decr   General Gait Details: cues for sequence, posture and position from RW; distance pain limited   Stairs Stairs: Yes Stairs assistance: Min assist Stair Management: No rails;One rail Right;Step to pattern;Forwards;Backwards;With walker;With cane Number of Stairs: 4 General stair comments: single step fwd and bkwd with RW; 2 step with rail and cane; written  instruction provided; son present and assisting   Wheelchair Mobility    Modified Rankin (Stroke Patients Only)       Balance Overall balance assessment: Mild deficits observed, not formally tested                                          Cognition Arousal/Alertness: Awake/alert Behavior During Therapy: WFL for tasks assessed/performed Overall Cognitive Status: Within Functional Limits for tasks assessed                                        Exercises Total Joint Exercises Ankle Circles/Pumps: AROM;Both;15 reps;Supine Quad Sets: AROM;Both;Supine;5 reps Heel Slides: AAROM;Left;Supine;10 reps Hip ABduction/ADduction: AAROM;Left;Supine;10 reps    General Comments        Pertinent Vitals/Pain Pain Assessment: 0-10 Pain Score: 7  Pain Location: L hip Pain Descriptors / Indicators: Aching;Sore;Burning Pain Intervention(s): Limited activity within patient's tolerance;Monitored during session;Premedicated before session;Ice applied    Home Living                      Prior Function            PT Goals (current goals can now be found in the care plan section) Acute Rehab PT Goals Patient Stated Goal: Regain IND PT Goal Formulation: With patient Time For Goal Achievement: 11/08/18 Potential to Achieve Goals: Good Progress towards PT goals: Progressing toward goals    Frequency    7X/week      PT  Plan Current plan remains appropriate    Co-evaluation              AM-PAC PT "6 Clicks" Mobility   Outcome Measure  Help needed turning from your back to your side while in a flat bed without using bedrails?: A Little Help needed moving from lying on your back to sitting on the side of a flat bed without using bedrails?: A Lot Help needed moving to and from a bed to a chair (including a wheelchair)?: A Little Help needed standing up from a chair using your arms (e.g., wheelchair or bedside chair)?: A Little Help  needed to walk in hospital room?: A Little Help needed climbing 3-5 steps with a railing? : A Little 6 Click Score: 17    End of Session Equipment Utilized During Treatment: Gait belt Activity Tolerance: Patient tolerated treatment well;Patient limited by pain Patient left: in bed;with call bell/phone within reach;with family/visitor present Nurse Communication: Mobility status PT Visit Diagnosis: Difficulty in walking, not elsewhere classified (R26.2)     Time: 2423-5361 PT Time Calculation (min) (ACUTE ONLY): 44 min  Charges:  $Gait Training: 8-22 mins $Therapeutic Exercise: 8-22 mins $Therapeutic Activity: 8-22 mins                     Debe Coder PT Acute Rehabilitation Services Pager [phone] Office [phone]    Edder Bellanca 11/02/2018, 2:10 PM

## 2018-11-02 NOTE — Progress Notes (Deleted)
[facility] arrived [facility], via stretcher escorted by carelink and daughter; pt has no c/o pain, no distress noted, will paged provider on call for admitting orders.

## 2018-11-02 NOTE — Progress Notes (Signed)
   Subjective: 1 Day Post-Op Procedure(s) (LRB): TOTAL HIP ARTHROPLASTY ANTERIOR APPROACH (Left) Patient reports pain as mild.   Patient seen in rounds with Dr. Wynelle Link. Patient is well, and has had no acute complaints or problems other than discomfort in the left hip. Denies chest pain or SOB. Foley catheter removed this AM.  We will continue therapy today.   Objective: Vital signs in last 24 hours: Temp:  [97.6 F (36.4 C)-98.8 F (37.1 C)] 98.3 F (36.8 C) (03/05 0527) Pulse Rate:  [49-67] 65 (03/05 0527) Resp:  [11-18] 16 (03/05 0527) BP: (103-144)/(67-96) 112/67 (03/05 0527) SpO2:  [95 %-100 %] 96 % (03/05 0527)  Intake/Output from previous day:  Intake/Output Summary (Last 24 hours) at 11/02/2018 0734 Last data filed at 11/02/2018 0529 Gross per 24 hour  Intake 3525.96 ml  Output 3250 ml  Net 275.96 ml    Labs: Recent Labs    11/02/18 0519  HGB 13.2   Recent Labs    11/02/18 0519  WBC 9.0  RBC 4.11  HCT 42.4  PLT 145*   Recent Labs    11/02/18 0519  NA 136  K 4.1  CL 105  CO2 23  BUN 12  CREATININE 0.74  GLUCOSE 118*  CALCIUM 9.1   Exam: General - Patient is Alert and Oriented Extremity - Neurologically intact Neurovascular intact Sensation intact distally Dorsiflexion/Plantar flexion intact Dressing - dressing C/D/I Motor Function - intact, moving foot and toes well on exam.   Past Medical History:  Diagnosis Date  . Cancer [facility]) 2008   breast cancer, colon cancer  . Contact dermatitis   . Depression    hx of xanax overdose   . Panic attack    last severe attack was 02-2018  . Pre-diabetes   . PTSD (post-traumatic stress disorder)     Assessment/Plan: 1 Day Post-Op Procedure(s) (LRB): TOTAL HIP ARTHROPLASTY ANTERIOR APPROACH (Left) Principal Problem:   OA (osteoarthritis) of hip  Estimated body mass index is 27.76 kg/m as calculated from the following:   Height as of this encounter: 5\' 6"  (1.676 m).   Weight as of this encounter:  78 kg. Advance diet Up with therapy D/C IV fluids  DVT Prophylaxis - Xarelto Weight bearing as tolerated. D/C O2 and pulse ox and try on room air. Hemovac pulled without difficulty, will continue therapy.  Plan is to go Home after hospital stay. Plan for discharge today with HEP as long as meeting goals with therapy and pain controlled with oral medications. Follow-up in the office in 2 weeks.   Theresa Duty, PA-C Orthopedic Surgery 11/02/2018, 7:34 AM

## 2018-11-02 NOTE — Progress Notes (Signed)
Physical Therapy Treatment Patient Details Name: Kayla Chang MRN: [id number] DOB: [birth year] Today's Date: 11/02/2018    History of Present Illness Pt s/p L THR     PT Comments    Pt very cooperative but with increased pain this am and requiring increased time and encouragement for all tasks.   Follow Up Recommendations  Follow surgeon's recommendation for DC plan and follow-up therapies     Equipment Recommendations  Rolling walker with 5" wheels    Recommendations for Other Services       Precautions / Restrictions Precautions Precautions: Fall Restrictions Weight Bearing Restrictions: No Other Position/Activity Restrictions: WBAT    Mobility  Bed Mobility               General bed mobility comments: NT, pt up in chair and requests back to same  Transfers Overall transfer level: Needs assistance Equipment used: Rolling walker (2 wheeled) Transfers: Sit to/from Stand Sit to Stand: Min assist         General transfer comment: cues for LE management and use of UEs to self assist  Ambulation/Gait Ambulation/Gait assistance: Min guard Gait Distance (Feet): 65 Feet Assistive device: Rolling walker (2 wheeled) Gait Pattern/deviations: Step-to pattern;Decreased step length - right;Decreased step length - left;Shuffle;Trunk flexed Gait velocity: decr   General Gait Details: cues for sequence, posture and position from RW; distance pain limited   Stairs             Wheelchair Mobility    Modified Rankin (Stroke Patients Only)       Balance Overall balance assessment: Mild deficits observed, not formally tested                                          Cognition Arousal/Alertness: Awake/alert Behavior During Therapy: WFL for tasks assessed/performed Overall Cognitive Status: Within Functional Limits for tasks assessed                                        Exercises Total Joint Exercises Ankle  Circles/Pumps: AROM;Both;15 reps;Supine Quad Sets: AROM;Both;10 reps;Supine Heel Slides: AAROM;Left;Supine;20 reps Hip ABduction/ADduction: AAROM;Left;15 reps;Supine    General Comments        Pertinent Vitals/Pain Pain Assessment: 0-10 Pain Score: 8  Pain Location: L hip Pain Descriptors / Indicators: Aching;Sore;Burning Pain Intervention(s): Limited activity within patient's tolerance;Monitored during session;Premedicated before session;Ice applied    Home Living                      Prior Function            PT Goals (current goals can now be found in the care plan section) Acute Rehab PT Goals Patient Stated Goal: Regain IND PT Goal Formulation: With patient Time For Goal Achievement: 11/08/18 Potential to Achieve Goals: Good Progress towards PT goals: Progressing toward goals    Frequency    7X/week      PT Plan Current plan remains appropriate    Co-evaluation              AM-PAC PT "6 Clicks" Mobility   Outcome Measure  Help needed turning from your back to your side while in a flat bed without using bedrails?: A Lot Help needed moving from lying on your back to sitting on the  side of a flat bed without using bedrails?: A Lot Help needed moving to and from a bed to a chair (including a wheelchair)?: A Lot Help needed standing up from a chair using your arms (e.g., wheelchair or bedside chair)?: A Little Help needed to walk in hospital room?: A Little Help needed climbing 3-5 steps with a railing? : A Lot 6 Click Score: 14    End of Session Equipment Utilized During Treatment: Gait belt Activity Tolerance: Patient tolerated treatment well Patient left: in chair;with call bell/phone within reach;with family/visitor present Nurse Communication: Mobility status PT Visit Diagnosis: Difficulty in walking, not elsewhere classified (R26.2)     Time: 9507-2257 PT Time Calculation (min) (ACUTE ONLY): 40 min  Charges:  $Gait Training: 23-37  mins $Therapeutic Exercise: 8-22 mins                     [place] Pager [phone] Office [phone]    [place] Hijazi 11/02/2018, 11:02 AM

## 2018-11-02 NOTE — Progress Notes (Signed)
This RN made rounds and found patient had panic attack, crying and shaking, stated she was extremely in pain; pt was encouraged to use call bell when needing  Help and verbalized understanding; IV PRN pain meds given with relief; pt was transferred to reclining chair per pt's request; will continue to monitor patient.

## 2018-11-02 NOTE — Progress Notes (Signed)
RN reviewed discharge instructions with patient and family. All questions answered.   Paperwork and prescriptions given.   NT rolled patient down with all belongings to family car. 

## 2018-11-06 NOTE — Discharge Summary (Signed)
Physician Discharge Summary   Patient ID: Kayla Chang MRN: [id number] DOB/AGE: Oct 14, 1960 58 y.o.  Admit date: 11/01/2018 Discharge date: 11/02/2018  Primary Diagnosis: Osteoarthritis, left hip   Admission Diagnoses:  Past Medical History:  Diagnosis Date  . Cancer [facility]) 2008   breast cancer, colon cancer  . Contact dermatitis   . Depression    hx of xanax overdose   . Panic attack    last severe attack was 02-2018  . Pre-diabetes   . PTSD (post-traumatic stress disorder)    Discharge Diagnoses:   Principal Problem:   OA (osteoarthritis) of hip Active Problems:   Osteoarthritis of hip  Estimated body mass index is 27.76 kg/m as calculated from the following:   Height as of this encounter: 5\' 6"  (1.676 m).   Weight as of this encounter: 78 kg.  Procedure:  Procedure(s) (LRB): TOTAL HIP ARTHROPLASTY ANTERIOR APPROACH (Left)   Consults: None  HPI: Kayla Chang is a 58 y.o. female who has advanced end-stage arthritis of their Left  hip with progressively worsening pain and dysfunction.The patient has failed nonoperative management and presents for total hip arthroplasty.   Laboratory Data: Admission on 11/01/2018, Discharged on 11/02/2018  Component Date Value Ref Range Status  . WBC 11/02/2018 9.0  4.0 - 10.5 K/uL Final  . RBC 11/02/2018 4.11  3.87 - 5.11 MIL/uL Final  . Hemoglobin 11/02/2018 13.2  12.0 - 15.0 g/dL Final  . HCT 11/02/2018 42.4  36.0 - 46.0 % Final  . MCV 11/02/2018 103.2* 80.0 - 100.0 fL Final  . MCH 11/02/2018 32.1  26.0 - 34.0 pg Final  . MCHC 11/02/2018 31.1  30.0 - 36.0 g/dL Final  . RDW 11/02/2018 12.1  11.5 - 15.5 % Final  . Platelets 11/02/2018 145* 150 - 400 K/uL Final  . nRBC 11/02/2018 0.0  0.0 - 0.2 % Final   Performed at [facility], [place] [address] [place], [place] [address]  . Sodium 11/02/2018 136  135 - 145 mmol/L Final  . Potassium 11/02/2018 4.1  3.5 - 5.1 mmol/L Final  . Chloride 11/02/2018 105  98 - 111  mmol/L Final  . CO2 11/02/2018 23  22 - 32 mmol/L Final  . Glucose, Bld 11/02/2018 118* 70 - 99 mg/dL Final  . BUN 11/02/2018 12  6 - 20 mg/dL Final  . Creatinine, Ser 11/02/2018 0.74  0.44 - 1.00 mg/dL Final  . Calcium 11/02/2018 9.1  8.9 - 10.3 mg/dL Final  . GFR calc non Af Amer 11/02/2018 >60  >60 mL/min Final  . GFR calc Af Amer 11/02/2018 >60  >60 mL/min Final  . Anion gap 11/02/2018 8  5 - 15 Final   Performed at [facility], [place] [address], [place], [place] [address]  Hospital Outpatient Visit on 10/26/2018  Component Date Value Ref Range Status  . Hgb A1c MFr Bld 10/26/2018 5.3  4.8 - 5.6 % Final   Comment: (NOTE) Pre diabetes:          5.7%-6.4% Diabetes:              >6.4% Glycemic control for   <7.0% adults with diabetes   . Mean Plasma Glucose 10/26/2018 105.41  mg/dL Final   Performed at [place] [address] [place], [place] [address]  . aPTT 10/26/2018 25  24 - 36 seconds Final   Performed at [facility], [place] [address], [place], [place] [address]  . WBC 10/26/2018  5.6  4.0 - 10.5 K/uL Final  . RBC 10/26/2018 4.56  3.87 - 5.11 MIL/uL Final  . Hemoglobin 10/26/2018 14.7  12.0 - 15.0 g/dL Final  . HCT 10/26/2018 44.9  36.0 - 46.0 % Final  . MCV 10/26/2018 98.5  80.0 - 100.0 fL Final  . MCH 10/26/2018 32.2  26.0 - 34.0 pg Final  . MCHC 10/26/2018 32.7  30.0 - 36.0 g/dL Final  . RDW 10/26/2018 12.2  11.5 - 15.5 % Final  . Platelets 10/26/2018 174  150 - 400 K/uL Final  . nRBC 10/26/2018 0.0  0.0 - 0.2 % Final   Performed at [facility], [place] [address], [place], [place] [address]  . Sodium 10/26/2018 141  135 - 145 mmol/L Final  . Potassium 10/26/2018 4.2  3.5 - 5.1 mmol/L Final  . Chloride 10/26/2018 111  98 - 111 mmol/L Final  . CO2 10/26/2018 22  22 - 32 mmol/L Final  . Glucose, Bld 10/26/2018 93  70 - 99 mg/dL Final  . BUN 10/26/2018 21* 6 - 20 mg/dL Final  . Creatinine, Ser 10/26/2018  0.80  0.44 - 1.00 mg/dL Final  . Calcium 10/26/2018 9.4  8.9 - 10.3 mg/dL Final  . Total Protein 10/26/2018 7.2  6.5 - 8.1 g/dL Final  . Albumin 10/26/2018 4.7  3.5 - 5.0 g/dL Final  . AST 10/26/2018 21  15 - 41 U/L Final  . ALT 10/26/2018 22  0 - 44 U/L Final  . Alkaline Phosphatase 10/26/2018 68  38 - 126 U/L Final  . Total Bilirubin 10/26/2018 0.6  0.3 - 1.2 mg/dL Final  . GFR calc non Af Amer 10/26/2018 >60  >60 mL/min Final  . GFR calc Af Amer 10/26/2018 >60  >60 mL/min Final  . Anion gap 10/26/2018 8  5 - 15 Final   Performed at [facility], [place] [address] [place], [place] [address]  . Prothrombin Time 10/26/2018 12.0  11.4 - 15.2 seconds Final  . INR 10/26/2018 0.9  0.8 - 1.2 Final   Comment: (NOTE) INR goal varies based on device and disease states. Performed at [facility], [place] [address], [place], [place] [address]   . ABO/RH(D) 10/26/2018 B POS   Final  . Antibody Screen 10/26/2018 NEG   Final  . Sample Expiration 10/26/2018 11/04/2018   Final  . Extend sample reason 10/26/2018    Final                   Value:NO TRANSFUSIONS OR PREGNANCY IN THE PAST 3 MONTHS Performed at [facility], [place] [address], [place], [place] [address]   . MRSA, PCR 10/26/2018 POSITIVE* NEGATIVE Final   Comment: RESULT CALLED TO, READ BACK BY AND VERIFIED WITH: NANNEY,B @ 0622 ON [id number] BY POTEAT,S   . Staphylococcus aureus 10/26/2018 POSITIVE* NEGATIVE Final   Comment: (NOTE) The Xpert SA Assay (FDA approved for NASAL specimens in patients 51 years of age and older), is one component of a comprehensive surveillance program. It is not intended to diagnose infection nor to guide or monitor treatment. Performed at [facility], [place] [address] [place], [place] [address]   . ABO/RH(D) 10/26/2018    Final                   Value:B POS Performed at [facility], [place] [address], [place], [place]  [address]      X-Rays:Dg Pelvis Portable  Result Date: 11/01/2018 CLINICAL DATA:  Status post left total hip replacement. EXAM: PORTABLE PELVIS 1-2 VIEWS COMPARISON:  Intraoperative image earlier today FINDINGS: Sequelae of left total hip arthroplasty are again identified with a drain in place and postoperative gas in the adjacent soft tissues. The prosthetic femoral and acetabular components are approximated with one another on this single image. No acute fracture is identified. Mild degenerative changes are noted at the right hip. IMPRESSION: Left total hip arthroplasty without evidence of acute complication. Electronically Signed   By: Logan Bores M.D.   On: 11/01/2018 11:26   Dg C-arm 1-60 Min-no Report  Result Date: 11/01/2018 Fluoroscopy was utilized by the requesting physician.  No radiographic interpretation.   Dg Hip Operative Unilat With Pelvis Right  Result Date: 11/01/2018 CLINICAL DATA:  Left total hip replacement. EXAM: OPERATIVE LEFT HIP (WITH PELVIS IF PERFORMED) 1 VIEW TECHNIQUE: Fluoroscopic spot image(s) were submitted for interpretation post-operatively. COMPARISON:  None. FINDINGS: A single intraoperative fluoroscopic image demonstrates a left total hip arthroplasty with the femoral component incompletely imaged. The components are approximated with one another on this single view. IMPRESSION: Intraoperative image during left total hip arthroplasty. Electronically Signed   By: Logan Bores M.D.   On: 11/01/2018 11:24    EKG: Orders placed or performed during the hospital encounter of 08/31/16  . ED EKG  . ED EKG  . EKG 12-Lead  . EKG 12-Lead  . EKG     Hospital Course: Kayla Chang is a 57 y.o. who was admitted to [facility]. They were brought to the operating room on 11/01/2018 and underwent Procedure(s): [place].  Patient tolerated the procedure well and was later transferred to the recovery room and then to the orthopaedic floor  for postoperative care. They were given PO and IV analgesics for pain control following their surgery. They were given 24 hours of postoperative antibiotics of  Anti-infectives (From admission, onward)   Start     Dose/Rate Route Frequency Ordered Stop   11/01/18 1430  ceFAZolin (ANCEF) IVPB 2g/100 mL premix     2 g 200 mL/hr over 30 Minutes Intravenous Every 6 hours 11/01/18 1148 11/01/18 2041   11/01/18 0630  ceFAZolin (ANCEF) IVPB 2g/100 mL premix     2 g 200 mL/hr over 30 Minutes Intravenous On call to O.R. 11/01/18 5409 11/01/18 8119     and started on DVT prophylaxis in the form of Xarelto.   PT and OT were ordered for total joint protocol. Discharge planning consulted to help with postop disposition and equipment needs.  Patient had a good night on the evening of surgery. They started to get up OOB with therapy on POD #0. Pt was seen during rounds and was ready to go home pending progress with therapy. Hemovac drain was pulled without difficulty. She worked with therapy on POD #1 and was meeting her goals. Pt was discharged to home later that day in stable condition.  Diet: Regular diet Activity: WBAT Follow-up: in 2 weeks Disposition: Home with HEP Discharged Condition: stable   Discharge Instructions    Call MD / Call 911   Complete by:  As directed    If you experience chest pain or shortness of breath, CALL 911 and be transported to the hospital emergency room.  If you develope a fever above 101 F, pus (white drainage) or increased drainage or redness at the wound, or calf pain, call your surgeon's office.   Change dressing  Complete by:  As directed    You may change your dressing on Friday, then change the dressing daily with sterile 4 x 4 inch gauze dressing and paper tape.   Constipation Prevention   Complete by:  As directed    Drink plenty of fluids.  Prune juice may be helpful.  You may use a stool softener, such as Colace (over the counter) 100 mg twice a day.  Use  MiraLax (over the counter) for constipation as needed.   Diet - low sodium heart healthy   Complete by:  As directed    Discharge instructions   Complete by:  As directed    Dr. Gaynelle Arabian Total Joint Specialist [facility] [address], [place], [place] [address] [phone]  ANTERIOR APPROACH TOTAL HIP REPLACEMENT POSTOPERATIVE DIRECTIONS   Hip Rehabilitation, Guidelines Following Surgery  The results of a hip operation are greatly improved after range of motion and muscle strengthening exercises. Follow all safety measures which are given to protect your hip. If any of these exercises cause increased pain or swelling in your joint, decrease the amount until you are comfortable again. Then slowly increase the exercises. Call your caregiver if you have problems or questions.   HOME CARE INSTRUCTIONS  Remove items at home which could result in a fall. This includes throw rugs or furniture in walking pathways.  ICE to the affected hip every three hours for 30 minutes at a time and then as needed for pain and swelling.  Continue to use ice on the hip for pain and swelling from surgery. You may notice swelling that will progress down to the foot and ankle.  This is normal after surgery.  Elevate the leg when you are not up walking on it.   Continue to use the breathing machine which will help keep your temperature down.  It is common for your temperature to cycle up and down following surgery, especially at night when you are not up moving around and exerting yourself.  The breathing machine keeps your lungs expanded and your temperature down.  DIET You may resume your previous home diet once your are discharged from the hospital.  DRESSING / WOUND CARE / SHOWERING You may shower 3 days after surgery. NO SOAKING/SUBMERGING IN THE BATHTUB.  If the bandage gets wet, change with a clean dry gauze.  If the incision gets wet, pat the wound dry with a clean towel. You may start  showering once you are discharged home but do not submerge the incision under water. Just pat the incision dry and apply a dry gauze dressing on daily. Change the surgical dressing daily and reapply a dry dressing each time.  ACTIVITY Walk with your walker as instructed. Use walker as long as suggested by your caregivers. Avoid periods of inactivity such as sitting longer than an hour when not asleep. This helps prevent blood clots.  You may resume a sexual relationship in one month or when given the OK by your doctor.  You may return to work once you are cleared by your doctor.  Do not drive a car for 6 weeks or until released by you surgeon.  Do not drive while taking narcotics.  WEIGHT BEARING Weight bearing as tolerated with assist device (walker, cane, etc) as directed, use it as long as suggested by your surgeon or therapist, typically at least 4-6 weeks.  POSTOPERATIVE CONSTIPATION PROTOCOL Constipation - defined medically as fewer than three stools per week and severe constipation  as less than one stool per week.  One of the most common issues patients have following surgery is constipation.  Even if you have a regular bowel pattern at home, your normal regimen is likely to be disrupted due to multiple reasons following surgery.  Combination of anesthesia, postoperative narcotics, change in appetite and fluid intake all can affect your bowels.  In order to avoid complications following surgery, here are some recommendations in order to help you during your recovery period.  Colace (docusate) - Pick up an over-the-counter form of Colace or another stool softener and take twice a day as long as you are requiring postoperative pain medications.  Take with a full glass of water daily.  If you experience loose stools or diarrhea, hold the colace until you stool forms back up.  If your symptoms do not get better within 1 week or if they get worse, check with your doctor.  Dulcolax (bisacodyl)  - Pick up over-the-counter and take as directed by the product packaging as needed to assist with the movement of your bowels.  Take with a full glass of water.  Use this product as needed if not relieved by Colace only.   MiraLax (polyethylene glycol) - Pick up over-the-counter to have on hand.  MiraLax is a solution that will increase the amount of water in your bowels to assist with bowel movements.  Take as directed and can mix with a glass of water, juice, soda, coffee, or tea.  Take if you go more than two days without a movement. Do not use MiraLax more than once per day. Call your doctor if you are still constipated or irregular after using this medication for 7 days in a row.  If you continue to have problems with postoperative constipation, please contact the office for further assistance and recommendations.  If you experience "the worst abdominal pain ever" or develop nausea or vomiting, please contact the office immediatly for further recommendations for treatment.  ITCHING  If you experience itching with your medications, try taking only a single pain pill, or even half a pain pill at a time.  You can also use Benadryl over the counter for itching or also to help with sleep.   TED HOSE STOCKINGS Wear the elastic stockings on both legs for three weeks following surgery during the day but you may remove then at night for sleeping.  MEDICATIONS See your medication summary on the "After Visit Summary" that the nursing staff will review with you prior to discharge.  You may have some home medications which will be placed on hold until you complete the course of blood thinner medication.  It is important for you to complete the blood thinner medication as prescribed by your surgeon.  Continue your approved medications as instructed at time of discharge.  PRECAUTIONS If you experience chest pain or shortness of breath - call 911 immediately for transfer to the hospital emergency department.    If you develop a fever greater that 101 F, purulent drainage from wound, increased redness or drainage from wound, foul odor from the wound/dressing, or calf pain - CONTACT YOUR SURGEON.                                                   FOLLOW-UP APPOINTMENTS Make sure you keep all of your appointments after your  operation with your surgeon and caregivers. You should call the office at the above phone number and make an appointment for approximately two weeks after the date of your surgery or on the date instructed by your surgeon outlined in the "After Visit Summary".  RANGE OF MOTION AND STRENGTHENING EXERCISES  These exercises are designed to help you keep full movement of your hip joint. Follow your caregiver's or physical therapist's instructions. Perform all exercises about fifteen times, three times per day or as directed. Exercise both hips, even if you have had only one joint replacement. These exercises can be done on a training (exercise) mat, on the floor, on a table or on a bed. Use whatever works the best and is most comfortable for you. Use music or television while you are exercising so that the exercises are a pleasant break in your day. This will make your life better with the exercises acting as a break in routine you can look forward to.  Lying on your back, slowly slide your foot toward your buttocks, raising your knee up off the floor. Then slowly slide your foot back down until your leg is straight again.  Lying on your back spread your legs as far apart as you can without causing discomfort.  Lying on your side, raise your upper leg and foot straight up from the floor as far as is comfortable. Slowly lower the leg and repeat.  Lying on your back, tighten up the muscle in the front of your thigh (quadriceps muscles). You can do this by keeping your leg straight and trying to raise your heel off the floor. This helps strengthen the largest muscle supporting your knee.  Lying on  your back, tighten up the muscles of your buttocks both with the legs straight and with the knee bent at a comfortable angle while keeping your heel on the floor.   IF YOU ARE TRANSFERRED TO A SKILLED REHAB FACILITY If the patient is transferred to a skilled rehab facility following release from the hospital, a list of the current medications will be sent to the facility for the patient to continue.  When discharged from the skilled rehab facility, please have the facility set up the patient's [place] prior to being released. Also, the skilled facility will be responsible for providing the patient with their medications at time of release from the facility to include their pain medication, the muscle relaxants, and their blood thinner medication. If the patient is still at the rehab facility at time of the two week follow up appointment, the skilled rehab facility will also need to assist the patient in arranging follow up appointment in our office and any transportation needs.  MAKE SURE YOU:  Understand these instructions.  Get help right away if you are not doing well or get worse.    Pick up stool softner and laxative for home use following surgery while on pain medications. Do not submerge incision under water. Please use good hand washing techniques while changing dressing each day. May shower starting three days after surgery. Please use a clean towel to pat the incision dry following showers. Continue to use ice for pain and swelling after surgery. Do not use any lotions or creams on the incision until instructed by your surgeon.   Do not sit on low chairs, stoools or toilet seats, as it may be difficult to get up from low surfaces   Complete by:  As directed    Driving restrictions  Complete by:  As directed    No driving for two weeks   TED hose   Complete by:  As directed    Use stockings (TED hose) for three weeks on both leg(s).  You may remove them at night  for sleeping.   Weight bearing as tolerated   Complete by:  As directed      Allergies as of 11/02/2018      Reactions   Aspirin Nausea And Vomiting   Adhesive [tape] Hives   Latex    Itching    Morphine And Related Hives   Hives, hallucinations   Vicodin [hydrocodone-acetaminophen] Hives, Itching      Medication List    STOP taking these medications   ibuprofen 200 MG tablet Commonly known as:  ADVIL,MOTRIN     TAKE these medications   acetaminophen 500 MG tablet Commonly known as:  TYLENOL Take 500-1,000 mg by mouth every 6 (six) hours as needed for moderate pain or headache.   busPIRone 15 MG tablet Commonly known as:  BUSPAR Take 15 mg by mouth daily.   busPIRone 5 MG tablet Commonly known as:  BUSPAR Take 1 tablet (5 mg total) by mouth 2 (two) times daily.   escitalopram 10 MG tablet Commonly known as:  LEXAPRO Take 10 mg by mouth daily.   HYDROmorphone 2 MG tablet Commonly known as:  DILAUDID Take 1-2 tablets (2-4 mg total) by mouth every 6 (six) hours as needed for moderate pain.   methocarbamol 500 MG tablet Commonly known as:  ROBAXIN Take 1 tablet (500 mg total) by mouth every 6 (six) hours as needed for muscle spasms.   omeprazole 20 MG tablet Commonly known as:  PRILOSEC OTC Take 20 mg by mouth daily as needed (acid reflux).   rivaroxaban 10 MG Tabs tablet Commonly known as:  XARELTO Take 1 tablet (10 mg total) by mouth daily with breakfast for 20 days. Then take one 81 mg aspirin once a day for three weeks. Then discontinue aspirin.            Discharge Care Instructions  (From admission, onward)         Start     Ordered   11/02/18 0000  Weight bearing as tolerated     11/02/18 0738   11/02/18 0000  Change dressing    Comments:  You may change your dressing on Friday, then change the dressing daily with sterile 4 x 4 inch gauze dressing and paper tape.   11/02/18 5681         Follow-up Information    Gaynelle Arabian, MD. Go on  11/14/2018.   Specialty:  Orthopedic Surgery Why:  You are scheduled for a post-operative appointment on 11-14-2018 at 2:00 pm. Contact information: [address] [place] [address] [phone]           Signed: Theresa Duty, PA-C Orthopedic Surgery 11/06/2018, 8:49 AM

## 2018-11-27 DIAGNOSIS — Z96642 Presence of left artificial hip joint: Secondary | ICD-10-CM | POA: Diagnosis not present

## 2018-12-07 DIAGNOSIS — Z471 Aftercare following joint replacement surgery: Secondary | ICD-10-CM | POA: Diagnosis not present

## 2018-12-07 DIAGNOSIS — Z96642 Presence of left artificial hip joint: Secondary | ICD-10-CM | POA: Diagnosis not present

## 2019-01-18 DIAGNOSIS — F418 Other specified anxiety disorders: Secondary | ICD-10-CM | POA: Diagnosis not present

## 2019-03-01 DIAGNOSIS — F418 Other specified anxiety disorders: Secondary | ICD-10-CM | POA: Diagnosis not present

## 2019-03-01 DIAGNOSIS — F17200 Nicotine dependence, unspecified, uncomplicated: Secondary | ICD-10-CM | POA: Diagnosis not present

## 2019-04-03 DIAGNOSIS — S46811A Strain of other muscles, fascia and tendons at shoulder and upper arm level, right arm, initial encounter: Secondary | ICD-10-CM | POA: Diagnosis not present

## 2019-04-03 DIAGNOSIS — R071 Chest pain on breathing: Secondary | ICD-10-CM | POA: Diagnosis not present

## 2019-05-19 ENCOUNTER — Emergency Department (INDEPENDENT_AMBULATORY_CARE_PROVIDER_SITE_OTHER)
Admission: EM | Admit: 2019-05-19 | Discharge: 2019-05-19 | Disposition: A | Discharge status: Home / Self Care | Payer: Medicare Other | Source: Home / Self Care

## 2019-05-19 ENCOUNTER — Encounter: Payer: Self-pay | Admitting: Emergency Medicine

## 2019-05-19 ENCOUNTER — Other Ambulatory Visit: Payer: Self-pay

## 2019-05-19 DIAGNOSIS — M791 Myalgia, unspecified site: Secondary | ICD-10-CM

## 2019-05-19 DIAGNOSIS — R52 Pain, unspecified: Secondary | ICD-10-CM

## 2019-05-19 DIAGNOSIS — R51 Headache: Secondary | ICD-10-CM | POA: Diagnosis not present

## 2019-05-19 DIAGNOSIS — R112 Nausea with vomiting, unspecified: Secondary | ICD-10-CM

## 2019-05-19 DIAGNOSIS — R6889 Other general symptoms and signs: Secondary | ICD-10-CM

## 2019-05-19 DIAGNOSIS — R109 Unspecified abdominal pain: Secondary | ICD-10-CM

## 2019-05-19 LAB — POCT INFLUENZA A/B
Influenza A, POC: NEGATIVE
Influenza B, POC: NEGATIVE

## 2019-05-19 MED ORDER — ONDANSETRON 4 MG PO TBDP
4.0000 mg | ORAL_TABLET | Freq: Once | ORAL | Status: AC
  Administered 2019-05-19: 4 mg via ORAL

## 2019-05-19 MED ORDER — ONDANSETRON 4 MG PO TBDP: 4.0000 mg | ORAL_TABLET | Freq: Three times a day (TID) | ORAL | 0 refills | Status: DC | PRN

## 2019-05-19 NOTE — ED Triage Notes (Signed)
Patient c/o sharp epigastric pain radiating up through her breast for a couple of hours.  Patient is having nausea and vomiting, no diarrhea.

## 2019-05-19 NOTE — Discharge Instructions (Signed)
°  You may take 500mg  acetaminophen every 4-6 hours or in combination with ibuprofen 400-600mg  every 6-8 hours as needed for pain, inflammation, and fever.  Be sure to well hydrated with clear liquids and get at least 8 hours of sleep at night, preferably more while sick.   Please follow up with family medicine in 1 week if needed.  Most results have been coming back within about 2 days.  Please stay quarantined at home until you receive your results. If you MUST go out, remain 6 feet from others, always wear a mask and practice good hand washing. If your results are negative, you will NOT be receiving a phone call. You may check your MyChart account, please see in this packet how to set on up if you do not already have one. There is also an app for phones you can download.   You WILL be notified for POSITIVE results.
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
## 2019-05-21 ENCOUNTER — Encounter (HOSPITAL_COMMUNITY): Payer: Self-pay

## 2019-05-21 LAB — NOVEL CORONAVIRUS, NAA: SARS-CoV-2, NAA: NOT DETECTED

## 2019-06-18 ENCOUNTER — Other Ambulatory Visit: Payer: Self-pay | Admitting: Family Medicine

## 2019-06-18 DIAGNOSIS — Z1231 Encounter for screening mammogram for malignant neoplasm of breast: Secondary | ICD-10-CM

## 2019-07-18 ENCOUNTER — Ambulatory Visit (INDEPENDENT_AMBULATORY_CARE_PROVIDER_SITE_OTHER): Payer: Medicare Other

## 2019-07-18 ENCOUNTER — Other Ambulatory Visit: Payer: Self-pay

## 2019-07-18 DIAGNOSIS — Z1231 Encounter for screening mammogram for malignant neoplasm of breast: Secondary | ICD-10-CM

## 2020-01-20 IMAGING — DX DG PORTABLE PELVIS
1 series · 1 of 1 positions shown · non-contrast
Comparison: Intraoperative image earlier today

CLINICAL DATA: Status post left total hip replacement.

EXAM:
PORTABLE PELVIS 1-2 VIEWS

[pelvis ap]
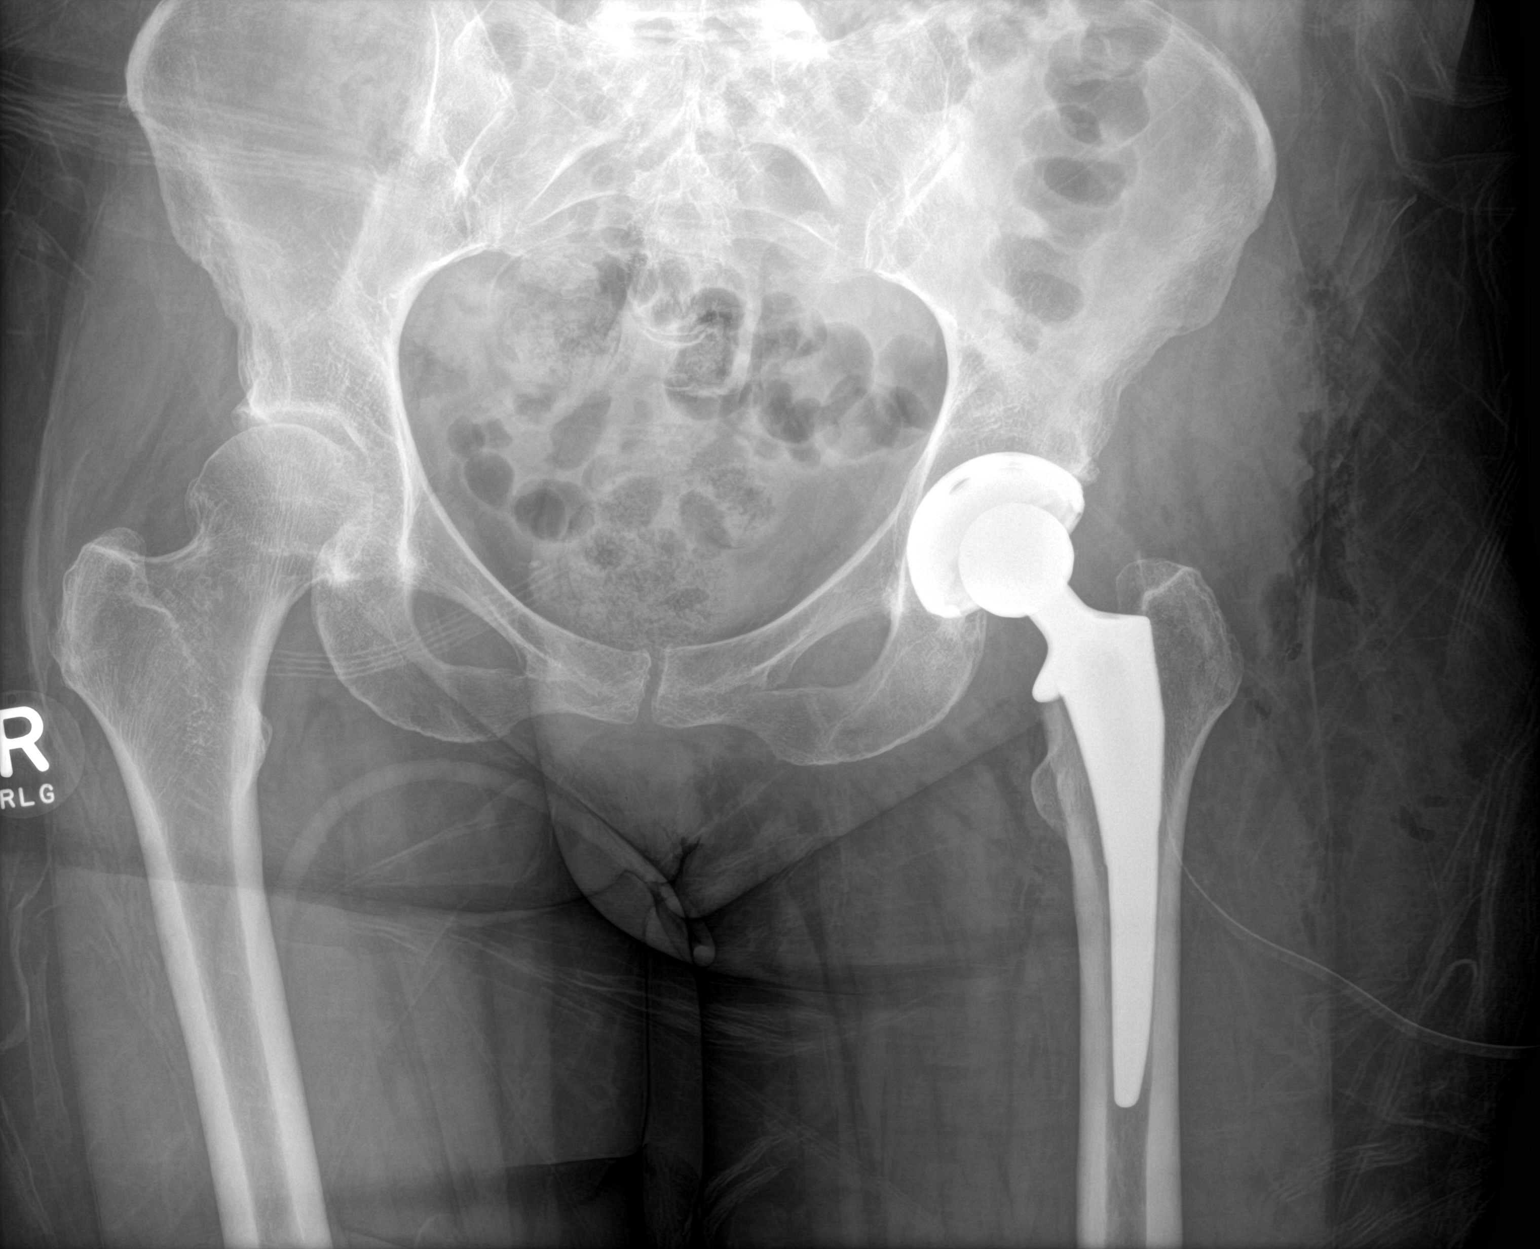

[1 of 1 positions shown; findings below may reference images not displayed]

FINDINGS: Sequelae of left total hip arthroplasty are again identified with a
drain in place and postoperative gas in the adjacent soft tissues.
The prosthetic femoral and acetabular components are approximated
with one another on this single image. No acute fracture is
identified. Mild degenerative changes are noted at the right hip.
IMPRESSION: Left total hip arthroplasty without evidence of acute complication.

## 2020-07-08 ENCOUNTER — Ambulatory Visit (INDEPENDENT_AMBULATORY_CARE_PROVIDER_SITE_OTHER): Payer: Medicare Other | Admitting: Family Medicine

## 2020-07-08 ENCOUNTER — Other Ambulatory Visit: Payer: Self-pay

## 2020-07-08 ENCOUNTER — Encounter: Payer: Self-pay | Admitting: Family Medicine

## 2020-07-08 ENCOUNTER — Telehealth: Payer: Self-pay

## 2020-07-08 VITALS — BP 152/88 | HR 62 | Temp 98.3°F | Ht 66.0 in | Wt 155.0 lb

## 2020-07-08 DIAGNOSIS — F419 Anxiety disorder, unspecified: Secondary | ICD-10-CM | POA: Diagnosis not present

## 2020-07-08 DIAGNOSIS — Z7289 Other problems related to lifestyle: Secondary | ICD-10-CM

## 2020-07-08 DIAGNOSIS — Z599 Problem related to housing and economic circumstances, unspecified: Secondary | ICD-10-CM

## 2020-07-08 DIAGNOSIS — Z7689 Persons encountering health services in other specified circumstances: Secondary | ICD-10-CM | POA: Diagnosis not present

## 2020-07-08 DIAGNOSIS — F332 Major depressive disorder, recurrent severe without psychotic features: Secondary | ICD-10-CM | POA: Diagnosis not present

## 2020-07-08 DIAGNOSIS — F322 Major depressive disorder, single episode, severe without psychotic features: Secondary | ICD-10-CM | POA: Diagnosis not present

## 2020-07-08 LAB — POCT URINALYSIS DIPSTICK
Bilirubin, UA: NEGATIVE
Blood, UA: NEGATIVE
Glucose, UA: NEGATIVE
Ketones, UA: NEGATIVE
Leukocytes, UA: NEGATIVE
Nitrite, UA: NEGATIVE
Protein, UA: NEGATIVE
Spec Grav, UA: 1.025 (ref 1.010–1.025)
Urobilinogen, UA: 0.2 E.U./dL
pH, UA: 6 (ref 5.0–8.0)

## 2020-07-08 NOTE — Chronic Care Management (AMB) (Signed)
  Chronic Care Management   Note  07/08/2020 Name: Kayla Chang MRN: [id number] DOB: [birth year]  Kayla Chang is a 59 y.o. year old female who is a primary care patient of Lorine Bears, Lupita Raider, FNP. I reached out to Kayla Chang by phone today in response to a referral sent by [name] PCP, [name], FNP     [name] was given information about Chronic Care Management services today including:  1. CCM service includes personalized support from designated clinical staff supervised by her physician, including individualized plan of care and coordination with other care providers 2. 24/7 contact phone numbers for assistance for urgent and routine care needs. 3. Service will only be billed when office clinical staff spend 20 minutes or more in a month to coordinate care. 4. Only one practitioner may furnish and bill the service in a calendar month. 5. The patient may stop CCM services at any time (effective at the end of the month) by phone call to the office staff. 6. The patient will be responsible for cost sharing (co-pay) of up to 20% of the service fee (after annual deductible is met).  Patient agreed to services and verbal consent obtained.   Follow up plan: Telephone appointment with care management team member scheduled for:07/15/2020  Noreene Larsson, [place], [place], [place] [address] Direct Dial: [phone] [email or web address]

## 2020-07-08 NOTE — Chronic Care Management (AMB) (Signed)
  Chronic Care Management   Outreach Note  07/08/2020 Name: HELEENA MICELI MRN: [id number] DOB: 1961-01-04  Phillip Heal is a 59 y.o. year old female who is a primary care patient of Lorine Bears, Lupita Raider, FNP. I reached out to Phillip Heal by phone today in response to a referral sent by [name] PCP, [name], FNP     An unsuccessful telephone outreach was attempted today. The patient was referred to the case management team for assistance with care management and care coordination.   Follow Up Plan: A HIPAA compliant phone message was left for the patient providing contact information and requesting a return call.  The care management team will reach out to the patient again over the next 3 days.  If patient returns call to provider office, please advise to call [facility] [place], [place], [place], [place] [address] Direct Dial: [phone] [email or web address]

## 2020-07-08 NOTE — Patient Instructions (Signed)
I have put in a CCM referral for our social worker to contact you regarding housing and food needs.  You should hear from her or one of our scheduling coordinators within the next few days to schedule a telephonic call.  We will plan to see you back in 4 weeks for follow up visit  You will receive a survey after today's visit either digitally by e-mail or paper by [place] mail. Your experiences and feedback matter to [place].  Please respond so we know how we are doing as we provide care for you.  Call us with any questions/concerns/needs.  It is my goal to be available to you for your health concerns.  Thanks for choosing me to be a partner in your healthcare needs!  Harlin Rain, FNP-C Family Nurse Practitioner [facility] Phone: [phone]
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
## 2020-07-09 NOTE — Assessment & Plan Note (Signed)
New patient establishment at [facility] for primary care.  Previously followed by [facility] in [place].  Reports recently having had labs drawn with their office.  Will request copies of medical records.  Plan: 1. RTC in 3 months for CPE 2. Will have labs drawn with CPE once requested medical records are received

## 2020-07-09 NOTE — Assessment & Plan Note (Signed)
See anxiety A/P. 

## 2020-07-15 ENCOUNTER — Telehealth: Payer: Medicare Other

## 2020-07-15 ENCOUNTER — Telehealth: Payer: Self-pay | Admitting: Licensed Clinical Social Worker

## 2020-07-15 ENCOUNTER — Telehealth: Payer: Self-pay | Admitting: Family Medicine

## 2020-07-15 NOTE — Telephone Encounter (Signed)
  Chronic Care Management    Clinical Social Work General Follow Up Note  07/15/2020 Name: Kayla Chang MRN: [id number] DOB: 1960/11/24  Kayla Chang is a 59 y.o. year old female who is a primary care patient of Verl Bangs, FNP. The CCM team was consulted for assistance with Mental Health Counseling and Resources.   Review of patient status, including review of consultants reports, relevant laboratory and other test results, and collaboration with appropriate care team members and the patient's provider was performed as part of comprehensive patient evaluation and provision of chronic care management services.    LCSW completed CCM outreach attempt today but was unable to reach patient successfully. A HIPPA compliant voice message was left encouraging patient to return call once available. LCSW will ask Scheduling Care Guide to reschedule CCM SW appointment with patient as well.  Outpatient Encounter Medications as of 07/15/2020  Medication Sig  . busPIRone (BUSPAR) 15 MG tablet Take 15 mg by mouth daily.   Marland Kitchen escitalopram (LEXAPRO) 20 MG tablet Take 20 mg by mouth daily.   Marland Kitchen omeprazole (PRILOSEC OTC) 20 MG tablet Take 20 mg by mouth daily as needed (acid reflux).    No facility-administered encounter medications on file as of 07/15/2020.    Follow Up Plan: Kayla Chang will reach out to patient to reschedule appointment.   Eula Fried, BSW, MSW, [email or web address] Phone: [phone]

## 2020-07-15 NOTE — Telephone Encounter (Signed)
   [facility] 07/15/2020   Name: Kayla Chang   MRN: [id number]   DOB: 1961-03-22   AGE: 59 y.o.   GENDER: female   PCP [place], Lupita Raider, FNP.    Received call from [name] regarding referral. Patient stated that she has actively been looking for a new place. She stated that she would like to live in [place], but if she she is still looking for a place in [place]. [name] stated that she has applied for several different apartments in the [place] area, but either not heard anything or her application was denied. Patient stated that she receives disability monthly so she would like to find something that is low income. Gave patient information for [employer] website and let patient know that Care Guide will research resources for [facility] as well as [facility] and e-mail her the information. Patient stated understanding.    [place], Care Management Phone: [phone] Email: [email or web address]

## 2020-07-15 NOTE — Telephone Encounter (Signed)
   SF 07/15/2020   Name: Kayla Chang   MRN: [id number]   DOB: June 08, 1961   AGE: 59 y.o.   GENDER: female   PCP Malfi, Lupita Raider, FNP.    Called patient regarding Community Resource Referral for assistance with housing. Left message for patient to give Care Guide a call back. Will try to give patient a call back within the week.   [place], Care Management Phone: [phone] Email: [email or web address]

## 2020-07-22 NOTE — Telephone Encounter (Signed)
   SF 07/22/2020   Name: Kayla Chang   MRN: [id number]   DOB: [birth year]   AGE: 59 y.o.   GENDER: female   PCP Malfi, Lupita Raider, FNP.    Per patient request sent  e-mail at [email or web address] with attachments for [place] [facility] [place]. No additional needs at this time.   Closing referral pending any other needs of patient.    [place], Care Management Phone: [phone] Email: [email or web address]

## 2020-07-23 ENCOUNTER — Ambulatory Visit (INDEPENDENT_AMBULATORY_CARE_PROVIDER_SITE_OTHER): Payer: Medicare Other

## 2020-07-23 ENCOUNTER — Other Ambulatory Visit: Payer: Self-pay

## 2020-07-23 DIAGNOSIS — Z1231 Encounter for screening mammogram for malignant neoplasm of breast: Secondary | ICD-10-CM | POA: Diagnosis not present

## 2020-07-28 ENCOUNTER — Telehealth: Payer: Self-pay

## 2020-07-28 NOTE — Chronic Care Management (AMB) (Signed)
  Care Management   Note  07/28/2020 Name: [name] MRN: [id number] DOB: 1961-03-12  Kayla Chang is a 59 y.o. year old female who is a primary care patient of Lorine Bears, Lupita Raider, [place] and is actively engaged with the care management team. I reached out to Phillip Heal by phone today to assist with re-scheduling an initial visit with the Licensed Clinical Social Worker  Follow up plan: Unsuccessful telephone outreach attempt made. A HIPAA compliant phone message was left for the patient providing contact information and requesting a return call.  The care management team will reach out to the patient again over the next 7 days.  If patient returns call to provider office, please advise to call [place]  at [place], [address], [place], [place] [address] Direct Dial: [phone] [email or web address]

## 2020-08-04 NOTE — Chronic Care Management (AMB) (Signed)
  Care Management   Note  08/04/2020 Name: [name] MRN: [id number] DOB: 1961/02/08  Kayla Chang is a 59 y.o. year old female who is a primary care patient of Lorine Bears, Lupita Raider, [place] and is actively engaged with the care management team. I reached out to Phillip Heal by phone today to assist with re-scheduling an initial visit with the Licensed Clinical Social Worker  Follow up plan: Unsuccessful telephone outreach attempt made. A HIPAA compliant phone message was left for the patient providing contact information and requesting a return call.  The care management team will reach out to the patient again over the next 7 days.  If patient returns call to provider office, please advise to call [place]  at [address], [place], [place], [place] [address] Direct Dial: [phone] [email or web address]: [email or web address]

## 2020-08-05 ENCOUNTER — Ambulatory Visit (INDEPENDENT_AMBULATORY_CARE_PROVIDER_SITE_OTHER): Payer: Medicare Other | Admitting: Family Medicine

## 2020-08-05 ENCOUNTER — Other Ambulatory Visit: Payer: Self-pay

## 2020-08-05 ENCOUNTER — Encounter: Payer: Self-pay | Admitting: Family Medicine

## 2020-08-05 VITALS — BP 132/71 | HR 62 | Temp 98.4°F | Resp 17 | Ht 66.0 in | Wt 157.0 lb

## 2020-08-05 DIAGNOSIS — K219 Gastro-esophageal reflux disease without esophagitis: Secondary | ICD-10-CM

## 2020-08-05 DIAGNOSIS — F419 Anxiety disorder, unspecified: Secondary | ICD-10-CM | POA: Diagnosis not present

## 2020-08-05 MED ORDER — ESCITALOPRAM OXALATE 20 MG PO TABS: 20.0000 mg | ORAL_TABLET | Freq: Every day | ORAL | 1 refills | Status: DC

## 2020-08-05 MED ORDER — OMEPRAZOLE MAGNESIUM 20 MG PO TBEC: 20.0000 mg | DELAYED_RELEASE_TABLET | Freq: Every day | ORAL | 1 refills | Status: DC | PRN

## 2020-08-05 MED ORDER — BUSPIRONE HCL 15 MG PO TABS: 15.0000 mg | ORAL_TABLET | Freq: Every day | ORAL | 1 refills | Status: DC

## 2020-08-05 NOTE — Assessment & Plan Note (Signed)
Currently managed well with escitalopram 20mg  daily and buspar 15mg  daily.  Has an interview later today with an apartment that she will find out if she qualifies for today.  Has anxiety because of the unknown but believes is well controlled with her current treatment plan.  Reports her new apartment will be in [place], [place] if she is approved and will be switching to a PCP closer to home.  Will plan to refill her prescriptions and have her schedule for a f/u in 6 months, should she remain local to our office.  Patient in agreement with plan.

## 2020-08-05 NOTE — Progress Notes (Signed)
Subjective:    Patient ID: Kayla Chang, female    DOB: 18-Jul-1961, 59 y.o.   MRN: [id number]  Kayla Chang is a 59 y.o. female presenting on 08/05/2020 for Anxiety   HPI  Kayla Chang presents to clinic for follow up on her anxiety.  Reports she has some slightly increased anxiety today, as she will find out if she has obtained an apartment she has been waiting for.  Reports otherwise well controlled and managed with her escitalopram and buspar.  Reports no acute concerns today.  Depression screen [facility] 2/9 08/05/2020 07/08/2020  Decreased Interest 1 2  Down, Depressed, Hopeless 1 2  PHQ - 2 Score 2 4  Altered sleeping 3 3  Tired, decreased energy 2 3  Change in appetite 1 3  Feeling bad or failure about yourself  0 0  Trouble concentrating 1 0  Moving slowly or fidgety/restless 0 0  Suicidal thoughts 0 0  PHQ-9 Score 9 13  Difficult doing work/chores Somewhat difficult Somewhat difficult    Social History   Tobacco Use  . Smoking status: Current Every Day Smoker    Packs/day: 0.50    Years: 38.00    Pack years: 19.00    Types: Cigarettes  . Smokeless tobacco: Never Used  Vaping Use  . Vaping Use: Never used  Substance Use Topics  . Alcohol use: Not Currently    Alcohol/week: 4.0 standard drinks    Types: 4 Cans of beer per week  . Drug use: No    Review of Systems  Constitutional: Negative.   HENT: Negative.   Eyes: Negative.   Respiratory: Negative.   Cardiovascular: Negative.   Gastrointestinal: Negative.   Endocrine: Negative.   Genitourinary: Negative.   Musculoskeletal: Negative.   Skin: Negative.   Allergic/Immunologic: Negative.   Neurological: Negative.   Hematological: Negative.   Psychiatric/Behavioral: Positive for dysphoric mood. Negative for agitation, behavioral problems, confusion, decreased concentration, hallucinations, self-injury, sleep disturbance and suicidal ideas. The patient is nervous/anxious. The patient is not hyperactive.     Per HPI unless specifically indicated above     Objective:    BP 132/71 (BP Location: Right Arm, Patient Position: Sitting, Cuff Size: Normal)   Pulse 62   Temp 98.4 F (36.9 C) (Oral)   Resp 17   Ht 5\' 6"  (1.676 m)   Wt 157 lb (71.2 kg)   SpO2 100%   BMI 25.34 kg/m   Wt Readings from Last 3 Encounters:  08/05/20 157 lb (71.2 kg)  07/08/20 155 lb (70.3 kg)  05/19/19 180 lb (81.6 kg)    Physical Exam Vitals and nursing note reviewed.  Constitutional:      General: She is not in acute distress.    Appearance: Normal appearance. She is well-developed and well-groomed. She is not ill-appearing or toxic-appearing.  HENT:     Head: Normocephalic and atraumatic.     Nose:     Comments: Kayla Chang is in place, covering mouth and nose. Eyes:     General: Lids are normal. Vision grossly intact.        Right eye: No discharge.        Left eye: No discharge.     Extraocular Movements: Extraocular movements intact.     Conjunctiva/sclera: Conjunctivae normal.     Pupils: Pupils are equal, round, and reactive to light.  Cardiovascular:     Rate and Rhythm: Normal rate and regular rhythm.     Pulses: Normal pulses.  Heart sounds: Normal heart sounds. No murmur heard.  No friction rub. No gallop.   Pulmonary:     Effort: Pulmonary effort is normal. No respiratory distress.     Breath sounds: Normal breath sounds.  Skin:    General: Skin is warm and dry.     Capillary Refill: Capillary refill takes less than 2 seconds.  Neurological:     General: No focal deficit present.     Mental Status: She is alert and oriented to person, place, and time.  Psychiatric:        Attention and Perception: Attention and perception normal.        Mood and Affect: Mood and affect normal.        Speech: Speech normal.        Behavior: Behavior normal. Behavior is cooperative.        Thought Content: Thought content normal.        Cognition and Memory: Cognition and memory normal.         Judgment: Judgment normal.    Results for orders placed or performed in visit on 07/08/20  POCT Urinalysis Dipstick  Result Value Ref Range   Color, UA yellow    Clarity, UA clear    Glucose, UA Negative Negative   Bilirubin, UA neg    Ketones, UA neg    Spec Grav, UA 1.025 1.010 - 1.025   Blood, UA neg    pH, UA 6.0 5.0 - 8.0   Protein, UA Negative Negative   Urobilinogen, UA 0.2 0.2 or 1.0 E.U./dL   Nitrite, UA neg    Leukocytes, UA Negative Negative   Appearance clear    Odor none       Assessment & Plan:   Problem List Items Addressed This Visit      Other   Anxiety - Primary    Currently managed well with escitalopram 20mg  daily and buspar 15mg  daily.  Has an interview later today with an apartment that she will find out if she qualifies for today.  Has anxiety because of the unknown but believes is well controlled with her current treatment plan.  Reports her new apartment will be in [place], [place] if she is approved and will be switching to a PCP closer to home.  Will plan to refill her prescriptions and have her schedule for a f/u in 6 months, should she remain local to our office.  Patient in agreement with plan.      Relevant Medications   escitalopram (LEXAPRO) 20 MG tablet   busPIRone (BUSPAR) 15 MG tablet    Other Visit Diagnoses    Gastroesophageal reflux disease, unspecified whether esophagitis present       Relevant Medications   omeprazole (PRILOSEC OTC) 20 MG tablet      Meds ordered this encounter  Medications  . escitalopram (LEXAPRO) 20 MG tablet    Sig: Take 1 tablet (20 mg total) by mouth daily.    Dispense:  90 tablet    Refill:  1  . busPIRone (BUSPAR) 15 MG tablet    Sig: Take 1 tablet (15 mg total) by mouth daily.    Dispense:  90 tablet    Refill:  1  . omeprazole (PRILOSEC OTC) 20 MG tablet    Sig: Take 1 tablet (20 mg total) by mouth daily as needed (acid reflux).    Dispense:  90 tablet    Refill:  1   Follow up plan: Return in  about 6  months (around 02/03/2021) for Anxiety f/u.   Harlin Rain, [place] Family Nurse Practitioner [facility] 08/05/2020, 1:08 PM

## 2020-08-05 NOTE — Patient Instructions (Signed)
Continue all medications as prescribed  The following recommendations are helpful adjuncts for helping rebalance your mood.  Eat a nourishing diet. Ensure adequate intake of calories, protein, carbs, fat, vitamins, and minerals. Prioritize whole foods at each meal, including meats, vegetables, fruits, nuts and seeds, etc.   Avoid inflammatory and/or "junk" foods, such as sugar, omega-6 fats, refined grains, chemicals, and preservatives are common in packaged and prepared foods. Minimize or completely avoid these ingredients and stick to whole foods with little to no additives. Cook from scratch as much as possible for more control over what you eat  Get enough sleep. Poor sleep is significantly associated with depression and anxiety. Make 7-9 hours of sleep nightly a top priority  Exercise appropriately. Exercise is known to improve brain functioning and boost mood. Aim for 30 minutes of daily physical activity. Avoid "overtraining," which can cause mental disturbances  Assess your light exposure. Not enough natural light during the day and too much artificial light can have a major impact on your mood. Get outside as often as possible during daylight hours. Minimize light exposure after dark and avoid the use of electronics that give off blue light before bed  Manage your stress.  Use daily stress management techniques such as meditation, yoga, or mindfulness to retrain your brain to respond differently to stress. Try deep breathing to deactivate your "fight or flight" response.  There are many of sources with apps like Headspace, Calm or a variety of YouTube videos (videos from Gwynne Edinger have guided meditation)  Prioritize your social life. Work on building social support with new friends or improve current relationships. Consider getting a pet that allows for companionship, social interaction, and physical touch. Try volunteering or joining a faith-based community to increase your sense of  purpose  4-7-8 breathing technique at bedtime: breathe in to count of 4, hold breath for count of 7, exhale for count of 8; do 3-5 times for letting go of overactive thoughts  Take time to play Unstructured "play" time can help reduce anxiety and depression Options for play include music, games, sports, dance, art, etc.  Try to add daily omega 3 fatty acids, magnesium, B complex, and balanced amino acid supplements to help improve mood and anxiety.  We will plan to see you back in 6 months for anxiety follow up if you have not established with a primary care provider closer to your new home by then  You will receive a survey after today's visit either digitally by e-mail or paper by [employer]. Your experiences and feedback matter to [place].  Please respond so we know how we are doing as we provide care for you.  Call us with any questions/concerns/needs.  It is my goal to be available to you for your health concerns.  Thanks for choosing me to be a partner in your healthcare needs!  Harlin Rain, FNP-C Family Nurse Practitioner [facility] Phone: [phone]

## 2020-08-11 NOTE — Chronic Care Management (AMB) (Signed)
°  Care Management   Note  08/11/2020 Name: [name] MRN: [id number] DOB: 12-26-1960  Kayla Chang is a 59 y.o. year old female who is a primary care patient of Lorine Bears, Lupita Raider, [place] and is actively engaged with the care management team. I reached out to Phillip Heal by phone today to assist with re-scheduling an initial visit with the Licensed Clinical Social Worker  Follow up plan: Unsuccessful telephone outreach attempt made. A HIPAA compliant phone message was left for the patient providing contact information and requesting a return call.  Unable to make contact on outreach attempts x 3. PCP [name], FNP notified via routed documentation in medical record.   Noreene Larsson, [address], [place], [place] [address] Direct Dial: [phone] [email or web address]

## 2020-08-11 NOTE — Telephone Encounter (Signed)
3rd unsuccessful outreach to r/s

## 2020-10-05 IMAGING — MG DIGITAL SCREENING BREAST BILAT IMPLANT W/ TOMO W/ CAD
8 of 12 series · 8 of 27 positions shown · non-contrast
Comparison: Previous exam(s).

CLINICAL DATA: Screening.

EXAM:
DIGITAL SCREENING BILATERAL MAMMOGRAM WITH IMPLANTS, CAD AND TOMO
The patient has prepectoral implants. Standard and implant displaced
views were performed.

[L MLO]
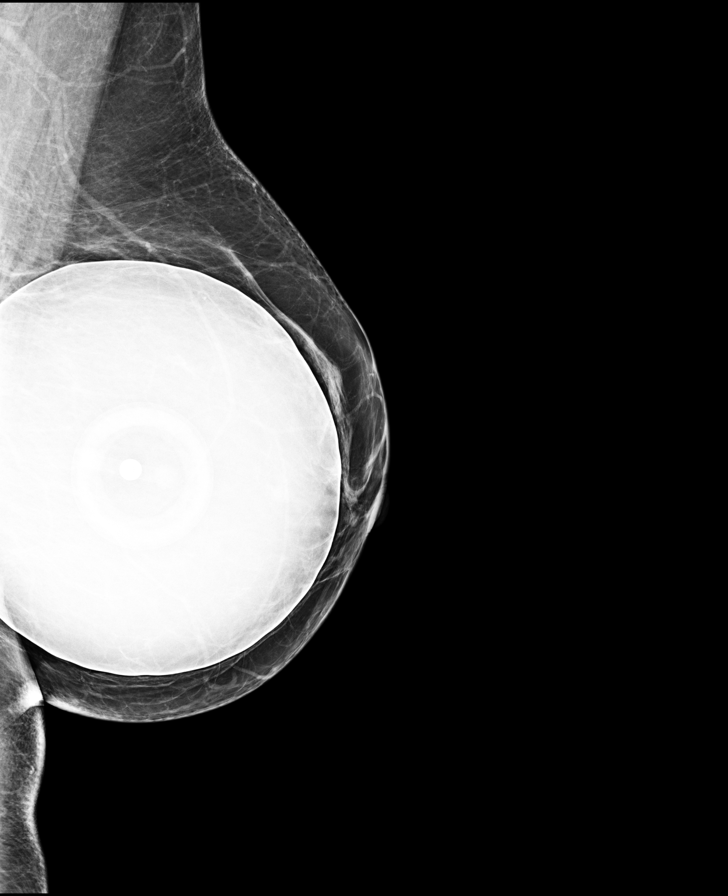

[R MLO]
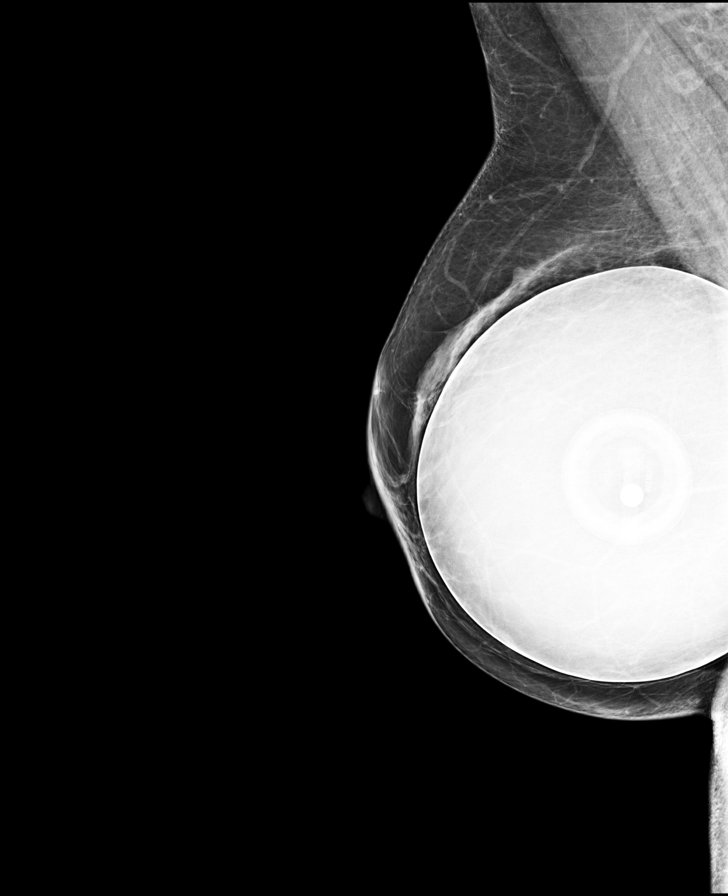

[L CC]
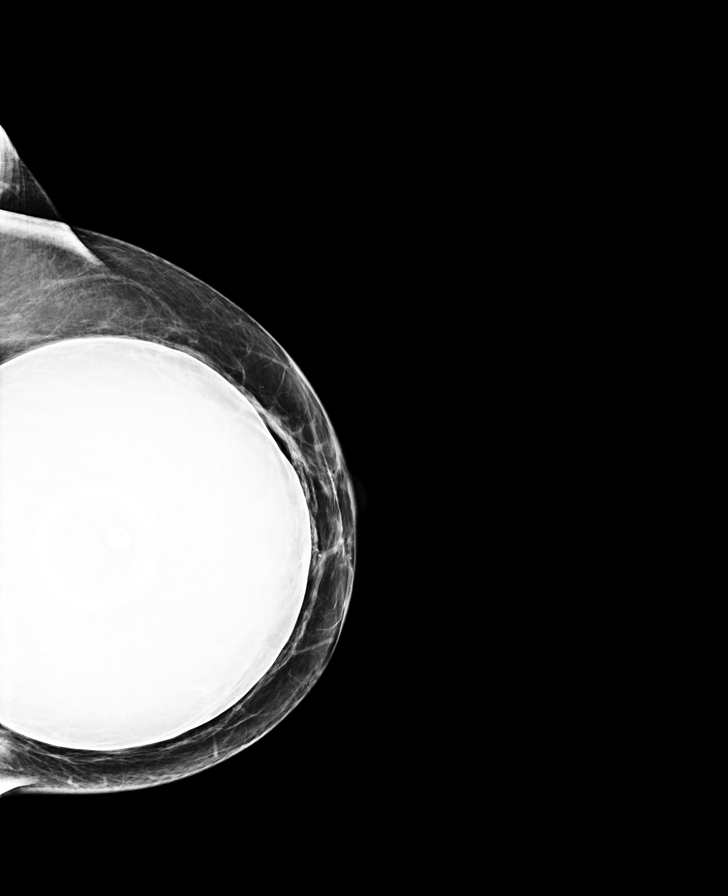

[R CC]
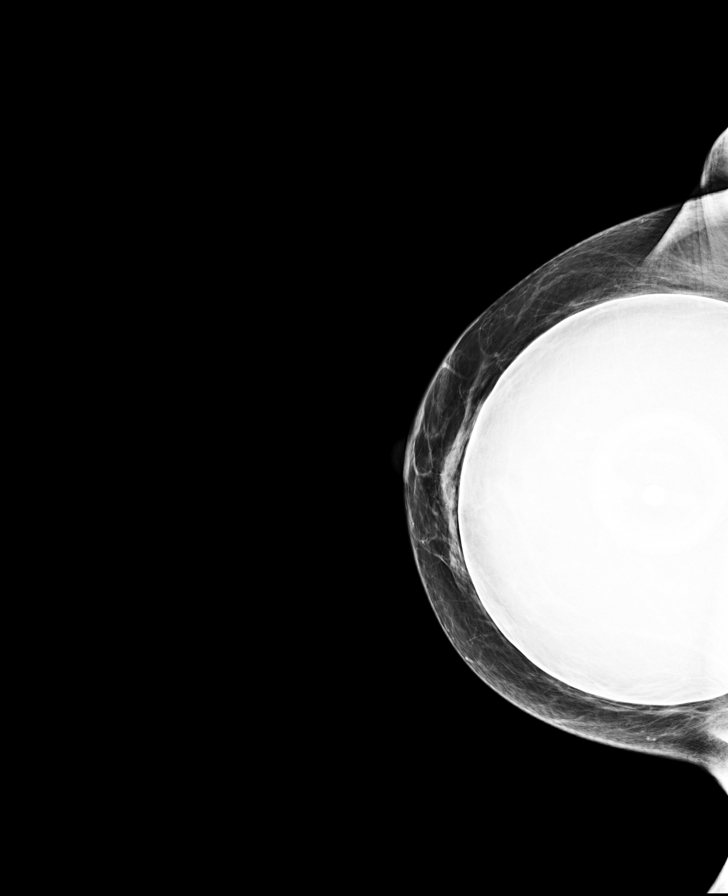

[L CC synth-2D]
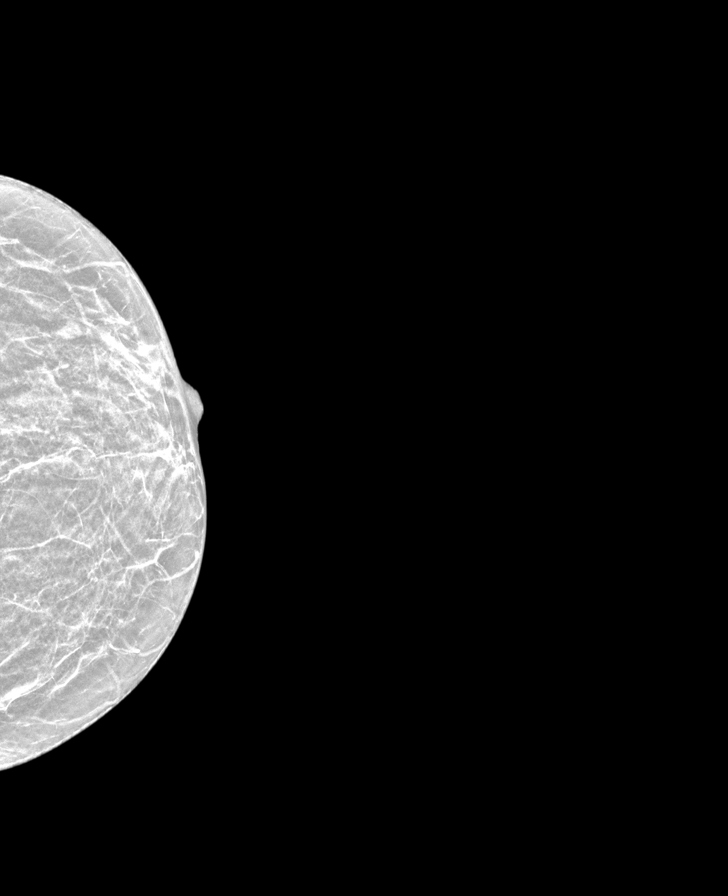

[R CC synth-2D]
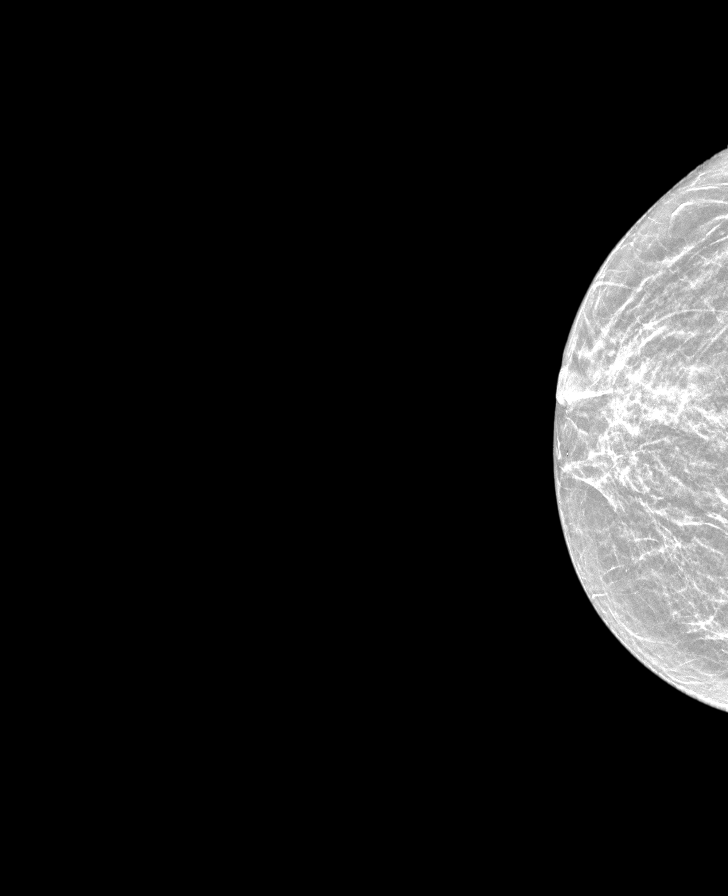

[L MLO synth-2D]
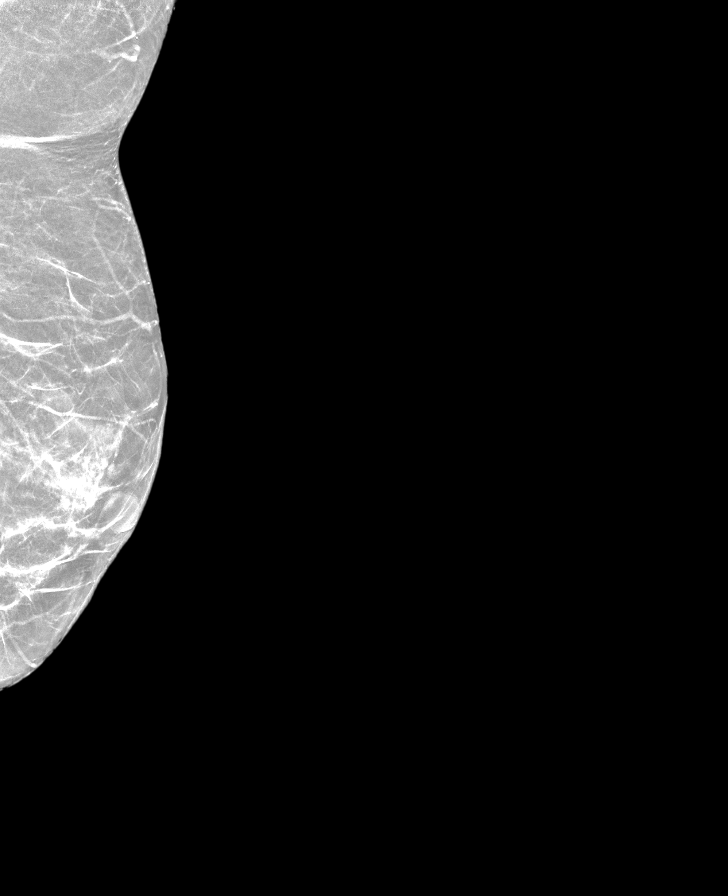

[R MLO synth-2D]
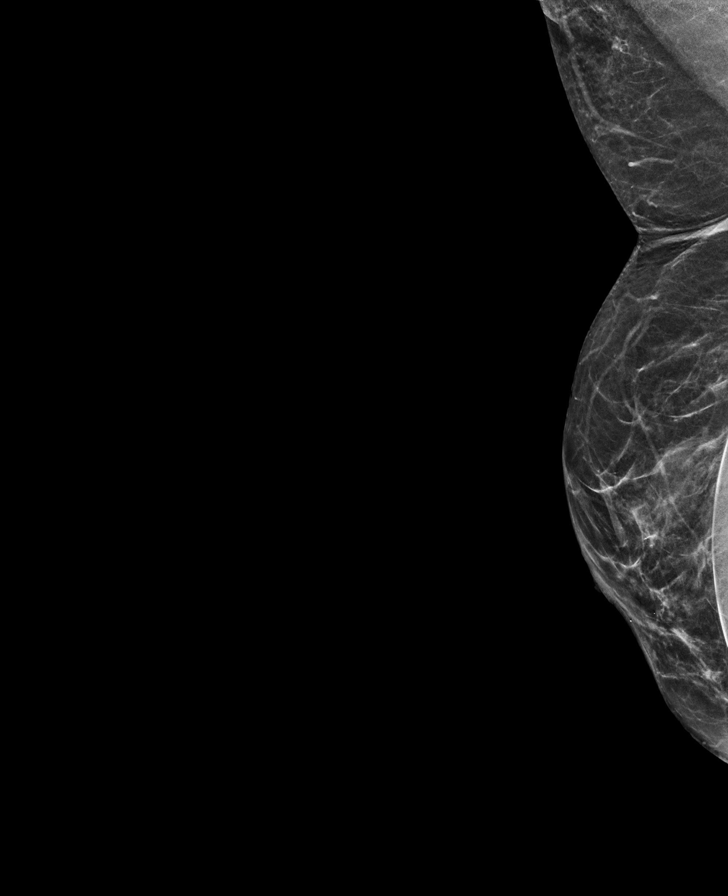

[8 of 27 positions shown; findings below may reference images not displayed]

ACR Breast Density Category b: There are scattered areas of
fibroglandular density.
FINDINGS: There are no findings suspicious for malignancy. Images were
processed with CAD.
IMPRESSION: No mammographic evidence of malignancy. A result letter of this
screening mammogram will be mailed directly to the patient.

RECOMMENDATION:
Screening mammogram in one year. (Code:15-K-3LP)

BI-RADS CATEGORY  1:  Negative.

## 2020-11-05 DIAGNOSIS — R519 Headache, unspecified: Secondary | ICD-10-CM | POA: Diagnosis not present

## 2020-11-05 DIAGNOSIS — R61 Generalized hyperhidrosis: Secondary | ICD-10-CM | POA: Diagnosis not present

## 2020-11-05 DIAGNOSIS — R110 Nausea: Secondary | ICD-10-CM | POA: Diagnosis not present

## 2020-11-05 DIAGNOSIS — R0602 Shortness of breath: Secondary | ICD-10-CM | POA: Diagnosis not present

## 2020-11-05 DIAGNOSIS — F418 Other specified anxiety disorders: Secondary | ICD-10-CM | POA: Diagnosis not present

## 2020-11-05 DIAGNOSIS — F3132 Bipolar disorder, current episode depressed, moderate: Secondary | ICD-10-CM | POA: Diagnosis not present

## 2021-02-18 ENCOUNTER — Other Ambulatory Visit: Payer: Self-pay

## 2021-04-21 ENCOUNTER — Other Ambulatory Visit: Payer: Self-pay | Admitting: Family Medicine

## 2021-04-21 DIAGNOSIS — Z1231 Encounter for screening mammogram for malignant neoplasm of breast: Secondary | ICD-10-CM

## 2021-05-01 ENCOUNTER — Other Ambulatory Visit: Payer: Self-pay | Admitting: Nurse Practitioner

## 2021-05-01 DIAGNOSIS — Z1231 Encounter for screening mammogram for malignant neoplasm of breast: Secondary | ICD-10-CM

## 2021-05-19 DIAGNOSIS — R69 Illness, unspecified: Secondary | ICD-10-CM | POA: Diagnosis not present

## 2021-05-19 DIAGNOSIS — R519 Headache, unspecified: Secondary | ICD-10-CM | POA: Diagnosis not present

## 2021-05-19 DIAGNOSIS — Z0000 Encounter for general adult medical examination without abnormal findings: Secondary | ICD-10-CM | POA: Diagnosis not present

## 2021-05-19 DIAGNOSIS — Z85828 Personal history of other malignant neoplasm of skin: Secondary | ICD-10-CM | POA: Diagnosis not present

## 2021-05-23 ENCOUNTER — Emergency Department (INDEPENDENT_AMBULATORY_CARE_PROVIDER_SITE_OTHER)
Admission: EM | Admit: 2021-05-23 | Discharge: 2021-05-23 | Disposition: A | Discharge status: Home / Self Care | Payer: Medicare HMO | Source: Home / Self Care

## 2021-05-23 ENCOUNTER — Other Ambulatory Visit: Payer: Self-pay

## 2021-05-23 DIAGNOSIS — H9201 Otalgia, right ear: Secondary | ICD-10-CM

## 2021-05-23 DIAGNOSIS — H6121 Impacted cerumen, right ear: Secondary | ICD-10-CM

## 2021-05-23 NOTE — Discharge Instructions (Addendum)
Advised/instructed patient to use OTC Debrox (carbamide peroxide) 4 drops twice daily into right ear for the next 5-7 days.  Advised patient wax may fall out on pillowcase or when bathing, advised if not please return to clinic for reevaluation.

## 2021-05-23 NOTE — ED Triage Notes (Signed)
Pt states that she has a foreign object in her right ear. X2 days. Pt states that she has some ear pain.
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
## 2021-07-02 DIAGNOSIS — M25551 Pain in right hip: Secondary | ICD-10-CM | POA: Diagnosis not present

## 2021-07-20 DIAGNOSIS — B9689 Other specified bacterial agents as the cause of diseases classified elsewhere: Secondary | ICD-10-CM | POA: Diagnosis not present

## 2021-07-20 DIAGNOSIS — Z20822 Contact with and (suspected) exposure to covid-19: Secondary | ICD-10-CM | POA: Diagnosis not present

## 2021-07-20 DIAGNOSIS — R059 Cough, unspecified: Secondary | ICD-10-CM | POA: Diagnosis not present

## 2021-07-20 DIAGNOSIS — J069 Acute upper respiratory infection, unspecified: Secondary | ICD-10-CM | POA: Diagnosis not present

## 2021-07-29 ENCOUNTER — Other Ambulatory Visit: Payer: Self-pay

## 2021-07-29 ENCOUNTER — Ambulatory Visit (INDEPENDENT_AMBULATORY_CARE_PROVIDER_SITE_OTHER): Payer: Medicare HMO

## 2021-07-29 DIAGNOSIS — Z1231 Encounter for screening mammogram for malignant neoplasm of breast: Secondary | ICD-10-CM | POA: Diagnosis not present

## 2021-08-02 DIAGNOSIS — I889 Nonspecific lymphadenitis, unspecified: Secondary | ICD-10-CM | POA: Diagnosis not present

## 2021-08-02 DIAGNOSIS — S2232XA Fracture of one rib, left side, initial encounter for closed fracture: Secondary | ICD-10-CM | POA: Diagnosis not present

## 2021-08-04 DIAGNOSIS — R221 Localized swelling, mass and lump, neck: Secondary | ICD-10-CM | POA: Diagnosis not present

## 2021-08-07 DIAGNOSIS — R030 Elevated blood-pressure reading, without diagnosis of hypertension: Secondary | ICD-10-CM | POA: Diagnosis not present

## 2021-08-07 DIAGNOSIS — M542 Cervicalgia: Secondary | ICD-10-CM | POA: Diagnosis not present

## 2021-08-07 DIAGNOSIS — N1330 Unspecified hydronephrosis: Secondary | ICD-10-CM | POA: Diagnosis not present

## 2021-08-07 DIAGNOSIS — J9811 Atelectasis: Secondary | ICD-10-CM | POA: Diagnosis not present

## 2021-08-07 DIAGNOSIS — Z79899 Other long term (current) drug therapy: Secondary | ICD-10-CM | POA: Diagnosis not present

## 2021-08-07 DIAGNOSIS — E278 Other specified disorders of adrenal gland: Secondary | ICD-10-CM | POA: Diagnosis not present

## 2021-08-07 DIAGNOSIS — R0781 Pleurodynia: Secondary | ICD-10-CM | POA: Diagnosis not present

## 2021-08-07 DIAGNOSIS — N2889 Other specified disorders of kidney and ureter: Secondary | ICD-10-CM | POA: Diagnosis not present

## 2021-08-07 DIAGNOSIS — R079 Chest pain, unspecified: Secondary | ICD-10-CM | POA: Diagnosis not present

## 2021-08-07 DIAGNOSIS — R918 Other nonspecific abnormal finding of lung field: Secondary | ICD-10-CM | POA: Diagnosis not present

## 2021-08-07 DIAGNOSIS — J984 Other disorders of lung: Secondary | ICD-10-CM | POA: Diagnosis not present

## 2021-08-07 DIAGNOSIS — I517 Cardiomegaly: Secondary | ICD-10-CM | POA: Diagnosis not present

## 2021-08-07 DIAGNOSIS — R911 Solitary pulmonary nodule: Secondary | ICD-10-CM | POA: Diagnosis not present

## 2021-08-07 DIAGNOSIS — Z1211 Encounter for screening for malignant neoplasm of colon: Secondary | ICD-10-CM | POA: Diagnosis not present

## 2021-08-07 DIAGNOSIS — R69 Illness, unspecified: Secondary | ICD-10-CM | POA: Diagnosis not present

## 2021-08-07 DIAGNOSIS — Z20822 Contact with and (suspected) exposure to covid-19: Secondary | ICD-10-CM | POA: Diagnosis not present

## 2021-08-07 DIAGNOSIS — N281 Cyst of kidney, acquired: Secondary | ICD-10-CM | POA: Diagnosis not present

## 2021-08-07 DIAGNOSIS — I700 Atherosclerosis of aorta: Secondary | ICD-10-CM | POA: Diagnosis not present

## 2021-08-07 DIAGNOSIS — R0602 Shortness of breath: Secondary | ICD-10-CM | POA: Diagnosis not present

## 2021-08-08 DIAGNOSIS — I517 Cardiomegaly: Secondary | ICD-10-CM | POA: Diagnosis not present

## 2021-08-13 DIAGNOSIS — M542 Cervicalgia: Secondary | ICD-10-CM | POA: Diagnosis not present

## 2021-08-13 DIAGNOSIS — R0789 Other chest pain: Secondary | ICD-10-CM | POA: Diagnosis not present

## 2021-08-13 DIAGNOSIS — N2889 Other specified disorders of kidney and ureter: Secondary | ICD-10-CM | POA: Diagnosis not present

## 2021-08-18 DIAGNOSIS — R221 Localized swelling, mass and lump, neck: Secondary | ICD-10-CM | POA: Diagnosis not present

## 2021-08-18 DIAGNOSIS — N2889 Other specified disorders of kidney and ureter: Secondary | ICD-10-CM | POA: Diagnosis not present

## 2021-08-18 DIAGNOSIS — E278 Other specified disorders of adrenal gland: Secondary | ICD-10-CM | POA: Diagnosis not present

## 2021-08-18 DIAGNOSIS — R918 Other nonspecific abnormal finding of lung field: Secondary | ICD-10-CM | POA: Diagnosis not present

## 2021-09-03 DIAGNOSIS — C7951 Secondary malignant neoplasm of bone: Secondary | ICD-10-CM | POA: Diagnosis not present

## 2021-09-03 DIAGNOSIS — R918 Other nonspecific abnormal finding of lung field: Secondary | ICD-10-CM | POA: Diagnosis not present

## 2021-09-03 DIAGNOSIS — N2889 Other specified disorders of kidney and ureter: Secondary | ICD-10-CM | POA: Diagnosis not present

## 2021-09-03 DIAGNOSIS — E278 Other specified disorders of adrenal gland: Secondary | ICD-10-CM | POA: Diagnosis not present

## 2021-09-03 DIAGNOSIS — D3502 Benign neoplasm of left adrenal gland: Secondary | ICD-10-CM | POA: Diagnosis not present

## 2021-09-03 DIAGNOSIS — C801 Malignant (primary) neoplasm, unspecified: Secondary | ICD-10-CM | POA: Diagnosis not present

## 2021-09-05 DIAGNOSIS — E278 Other specified disorders of adrenal gland: Secondary | ICD-10-CM | POA: Diagnosis not present

## 2021-09-05 DIAGNOSIS — R932 Abnormal findings on diagnostic imaging of liver and biliary tract: Secondary | ICD-10-CM | POA: Diagnosis not present

## 2021-09-05 DIAGNOSIS — N2889 Other specified disorders of kidney and ureter: Secondary | ICD-10-CM | POA: Diagnosis not present

## 2021-09-07 DIAGNOSIS — N2889 Other specified disorders of kidney and ureter: Secondary | ICD-10-CM | POA: Diagnosis not present

## 2021-09-11 DIAGNOSIS — Z1211 Encounter for screening for malignant neoplasm of colon: Secondary | ICD-10-CM | POA: Diagnosis not present

## 2021-09-17 DIAGNOSIS — E278 Other specified disorders of adrenal gland: Secondary | ICD-10-CM | POA: Diagnosis not present

## 2021-09-17 DIAGNOSIS — R918 Other nonspecific abnormal finding of lung field: Secondary | ICD-10-CM | POA: Diagnosis not present

## 2021-09-18 DIAGNOSIS — D4412 Neoplasm of uncertain behavior of left adrenal gland: Secondary | ICD-10-CM | POA: Diagnosis not present

## 2021-09-22 DIAGNOSIS — C642 Malignant neoplasm of left kidney, except renal pelvis: Secondary | ICD-10-CM | POA: Diagnosis not present

## 2021-09-22 DIAGNOSIS — C7951 Secondary malignant neoplasm of bone: Secondary | ICD-10-CM | POA: Diagnosis not present

## 2021-09-22 DIAGNOSIS — C649 Malignant neoplasm of unspecified kidney, except renal pelvis: Secondary | ICD-10-CM | POA: Diagnosis not present

## 2021-09-28 DIAGNOSIS — C649 Malignant neoplasm of unspecified kidney, except renal pelvis: Secondary | ICD-10-CM | POA: Diagnosis not present

## 2021-09-28 DIAGNOSIS — C7951 Secondary malignant neoplasm of bone: Secondary | ICD-10-CM | POA: Diagnosis not present

## 2021-09-28 DIAGNOSIS — Z510 Encounter for antineoplastic radiation therapy: Secondary | ICD-10-CM | POA: Diagnosis not present

## 2021-09-30 DIAGNOSIS — C7951 Secondary malignant neoplasm of bone: Secondary | ICD-10-CM | POA: Diagnosis not present

## 2021-09-30 DIAGNOSIS — C649 Malignant neoplasm of unspecified kidney, except renal pelvis: Secondary | ICD-10-CM | POA: Diagnosis not present

## 2021-10-01 DIAGNOSIS — C7951 Secondary malignant neoplasm of bone: Secondary | ICD-10-CM | POA: Diagnosis not present

## 2021-10-01 DIAGNOSIS — G893 Neoplasm related pain (acute) (chronic): Secondary | ICD-10-CM | POA: Diagnosis not present

## 2021-10-01 DIAGNOSIS — Z510 Encounter for antineoplastic radiation therapy: Secondary | ICD-10-CM | POA: Diagnosis not present

## 2021-10-01 DIAGNOSIS — C649 Malignant neoplasm of unspecified kidney, except renal pelvis: Secondary | ICD-10-CM | POA: Diagnosis not present

## 2021-10-05 DIAGNOSIS — C7951 Secondary malignant neoplasm of bone: Secondary | ICD-10-CM | POA: Diagnosis not present

## 2021-10-05 DIAGNOSIS — C649 Malignant neoplasm of unspecified kidney, except renal pelvis: Secondary | ICD-10-CM | POA: Diagnosis not present

## 2021-10-05 DIAGNOSIS — Z510 Encounter for antineoplastic radiation therapy: Secondary | ICD-10-CM | POA: Diagnosis not present

## 2021-10-06 DIAGNOSIS — C7951 Secondary malignant neoplasm of bone: Secondary | ICD-10-CM | POA: Diagnosis not present

## 2021-10-06 DIAGNOSIS — Z510 Encounter for antineoplastic radiation therapy: Secondary | ICD-10-CM | POA: Diagnosis not present

## 2021-10-06 DIAGNOSIS — C649 Malignant neoplasm of unspecified kidney, except renal pelvis: Secondary | ICD-10-CM | POA: Diagnosis not present

## 2021-10-07 DIAGNOSIS — Z510 Encounter for antineoplastic radiation therapy: Secondary | ICD-10-CM | POA: Diagnosis not present

## 2021-10-07 DIAGNOSIS — C7951 Secondary malignant neoplasm of bone: Secondary | ICD-10-CM | POA: Diagnosis not present

## 2021-10-07 DIAGNOSIS — C649 Malignant neoplasm of unspecified kidney, except renal pelvis: Secondary | ICD-10-CM | POA: Diagnosis not present

## 2021-10-08 DIAGNOSIS — Z510 Encounter for antineoplastic radiation therapy: Secondary | ICD-10-CM | POA: Diagnosis not present

## 2021-10-08 DIAGNOSIS — C7951 Secondary malignant neoplasm of bone: Secondary | ICD-10-CM | POA: Diagnosis not present

## 2021-10-08 DIAGNOSIS — C649 Malignant neoplasm of unspecified kidney, except renal pelvis: Secondary | ICD-10-CM | POA: Diagnosis not present

## 2021-10-09 DIAGNOSIS — Z510 Encounter for antineoplastic radiation therapy: Secondary | ICD-10-CM | POA: Diagnosis not present

## 2021-10-09 DIAGNOSIS — C649 Malignant neoplasm of unspecified kidney, except renal pelvis: Secondary | ICD-10-CM | POA: Diagnosis not present

## 2021-10-09 DIAGNOSIS — C7951 Secondary malignant neoplasm of bone: Secondary | ICD-10-CM | POA: Diagnosis not present

## 2021-10-15 DIAGNOSIS — C7951 Secondary malignant neoplasm of bone: Secondary | ICD-10-CM | POA: Diagnosis not present

## 2021-10-15 DIAGNOSIS — R69 Illness, unspecified: Secondary | ICD-10-CM | POA: Diagnosis not present

## 2021-10-15 DIAGNOSIS — Z79899 Other long term (current) drug therapy: Secondary | ICD-10-CM | POA: Diagnosis not present

## 2021-10-15 DIAGNOSIS — R140 Abdominal distension (gaseous): Secondary | ICD-10-CM | POA: Diagnosis not present

## 2021-10-15 DIAGNOSIS — Z5112 Encounter for antineoplastic immunotherapy: Secondary | ICD-10-CM | POA: Diagnosis not present

## 2021-10-15 DIAGNOSIS — C649 Malignant neoplasm of unspecified kidney, except renal pelvis: Secondary | ICD-10-CM | POA: Diagnosis not present

## 2021-10-23 DIAGNOSIS — C7951 Secondary malignant neoplasm of bone: Secondary | ICD-10-CM | POA: Diagnosis not present

## 2021-10-23 DIAGNOSIS — C649 Malignant neoplasm of unspecified kidney, except renal pelvis: Secondary | ICD-10-CM | POA: Diagnosis not present

## 2021-10-23 DIAGNOSIS — Z452 Encounter for adjustment and management of vascular access device: Secondary | ICD-10-CM | POA: Diagnosis not present

## 2021-10-30 DIAGNOSIS — C7951 Secondary malignant neoplasm of bone: Secondary | ICD-10-CM | POA: Diagnosis not present

## 2021-10-30 DIAGNOSIS — C649 Malignant neoplasm of unspecified kidney, except renal pelvis: Secondary | ICD-10-CM | POA: Diagnosis not present

## 2021-11-02 DIAGNOSIS — R0781 Pleurodynia: Secondary | ICD-10-CM | POA: Diagnosis not present

## 2021-11-02 DIAGNOSIS — C7951 Secondary malignant neoplasm of bone: Secondary | ICD-10-CM | POA: Diagnosis not present

## 2021-11-02 DIAGNOSIS — C649 Malignant neoplasm of unspecified kidney, except renal pelvis: Secondary | ICD-10-CM | POA: Diagnosis not present

## 2021-11-02 DIAGNOSIS — L03221 Cellulitis of neck: Secondary | ICD-10-CM | POA: Diagnosis not present

## 2021-11-02 DIAGNOSIS — T80212D Local infection due to central venous catheter, subsequent encounter: Secondary | ICD-10-CM | POA: Diagnosis not present

## 2021-11-02 DIAGNOSIS — L299 Pruritus, unspecified: Secondary | ICD-10-CM | POA: Diagnosis not present

## 2021-11-05 DIAGNOSIS — C649 Malignant neoplasm of unspecified kidney, except renal pelvis: Secondary | ICD-10-CM | POA: Diagnosis not present

## 2021-11-05 DIAGNOSIS — C7951 Secondary malignant neoplasm of bone: Secondary | ICD-10-CM | POA: Diagnosis not present

## 2021-11-05 DIAGNOSIS — Z5112 Encounter for antineoplastic immunotherapy: Secondary | ICD-10-CM | POA: Diagnosis not present

## 2021-11-06 DIAGNOSIS — L270 Generalized skin eruption due to drugs and medicaments taken internally: Secondary | ICD-10-CM | POA: Diagnosis not present

## 2021-11-12 DIAGNOSIS — C649 Malignant neoplasm of unspecified kidney, except renal pelvis: Secondary | ICD-10-CM | POA: Diagnosis not present

## 2021-11-12 DIAGNOSIS — L270 Generalized skin eruption due to drugs and medicaments taken internally: Secondary | ICD-10-CM | POA: Diagnosis not present

## 2021-11-12 DIAGNOSIS — C7951 Secondary malignant neoplasm of bone: Secondary | ICD-10-CM | POA: Diagnosis not present

## 2021-11-16 DIAGNOSIS — C7951 Secondary malignant neoplasm of bone: Secondary | ICD-10-CM | POA: Diagnosis not present

## 2021-11-16 DIAGNOSIS — R197 Diarrhea, unspecified: Secondary | ICD-10-CM | POA: Diagnosis not present

## 2021-11-16 DIAGNOSIS — N1330 Unspecified hydronephrosis: Secondary | ICD-10-CM | POA: Diagnosis not present

## 2021-11-16 DIAGNOSIS — K521 Toxic gastroenteritis and colitis: Secondary | ICD-10-CM | POA: Diagnosis not present

## 2021-11-16 DIAGNOSIS — E278 Other specified disorders of adrenal gland: Secondary | ICD-10-CM | POA: Diagnosis not present

## 2021-11-16 DIAGNOSIS — R112 Nausea with vomiting, unspecified: Secondary | ICD-10-CM | POA: Diagnosis not present

## 2021-11-16 DIAGNOSIS — R0602 Shortness of breath: Secondary | ICD-10-CM | POA: Diagnosis not present

## 2021-11-16 DIAGNOSIS — F17210 Nicotine dependence, cigarettes, uncomplicated: Secondary | ICD-10-CM | POA: Diagnosis not present

## 2021-11-16 DIAGNOSIS — J9811 Atelectasis: Secondary | ICD-10-CM | POA: Diagnosis not present

## 2021-11-16 DIAGNOSIS — Z888 Allergy status to other drugs, medicaments and biological substances status: Secondary | ICD-10-CM | POA: Diagnosis not present

## 2021-11-16 DIAGNOSIS — Z91040 Latex allergy status: Secondary | ICD-10-CM | POA: Diagnosis not present

## 2021-11-16 DIAGNOSIS — R918 Other nonspecific abnormal finding of lung field: Secondary | ICD-10-CM | POA: Diagnosis not present

## 2021-11-16 DIAGNOSIS — Z79899 Other long term (current) drug therapy: Secondary | ICD-10-CM | POA: Diagnosis not present

## 2021-11-16 DIAGNOSIS — Z885 Allergy status to narcotic agent status: Secondary | ICD-10-CM | POA: Diagnosis not present

## 2021-11-16 DIAGNOSIS — K769 Liver disease, unspecified: Secondary | ICD-10-CM | POA: Diagnosis not present

## 2021-11-16 DIAGNOSIS — R109 Unspecified abdominal pain: Secondary | ICD-10-CM | POA: Diagnosis not present

## 2021-11-16 DIAGNOSIS — R69 Illness, unspecified: Secondary | ICD-10-CM | POA: Diagnosis not present

## 2021-11-16 DIAGNOSIS — Z8589 Personal history of malignant neoplasm of other organs and systems: Secondary | ICD-10-CM | POA: Diagnosis not present

## 2021-11-16 DIAGNOSIS — T451X5A Adverse effect of antineoplastic and immunosuppressive drugs, initial encounter: Secondary | ICD-10-CM | POA: Diagnosis not present

## 2021-11-16 DIAGNOSIS — N281 Cyst of kidney, acquired: Secondary | ICD-10-CM | POA: Diagnosis not present

## 2021-11-16 DIAGNOSIS — R1084 Generalized abdominal pain: Secondary | ICD-10-CM | POA: Diagnosis not present

## 2021-11-16 DIAGNOSIS — E049 Nontoxic goiter, unspecified: Secondary | ICD-10-CM | POA: Diagnosis not present

## 2021-11-16 DIAGNOSIS — Z886 Allergy status to analgesic agent status: Secondary | ICD-10-CM | POA: Diagnosis not present

## 2021-11-24 DIAGNOSIS — C7951 Secondary malignant neoplasm of bone: Secondary | ICD-10-CM | POA: Diagnosis not present

## 2021-11-24 DIAGNOSIS — C649 Malignant neoplasm of unspecified kidney, except renal pelvis: Secondary | ICD-10-CM | POA: Diagnosis not present

## 2021-11-24 DIAGNOSIS — E069 Thyroiditis, unspecified: Secondary | ICD-10-CM | POA: Diagnosis not present

## 2021-11-26 DIAGNOSIS — C7951 Secondary malignant neoplasm of bone: Secondary | ICD-10-CM | POA: Diagnosis not present

## 2021-11-26 DIAGNOSIS — L270 Generalized skin eruption due to drugs and medicaments taken internally: Secondary | ICD-10-CM | POA: Diagnosis not present

## 2021-11-26 DIAGNOSIS — C649 Malignant neoplasm of unspecified kidney, except renal pelvis: Secondary | ICD-10-CM | POA: Diagnosis not present

## 2021-11-26 DIAGNOSIS — E069 Thyroiditis, unspecified: Secondary | ICD-10-CM | POA: Diagnosis not present

## 2021-12-14 DIAGNOSIS — R69 Illness, unspecified: Secondary | ICD-10-CM | POA: Diagnosis not present

## 2021-12-14 DIAGNOSIS — C7951 Secondary malignant neoplasm of bone: Secondary | ICD-10-CM | POA: Diagnosis not present

## 2021-12-14 DIAGNOSIS — M545 Low back pain, unspecified: Secondary | ICD-10-CM | POA: Diagnosis not present

## 2021-12-14 DIAGNOSIS — Z79899 Other long term (current) drug therapy: Secondary | ICD-10-CM | POA: Diagnosis not present

## 2021-12-14 DIAGNOSIS — R0781 Pleurodynia: Secondary | ICD-10-CM | POA: Diagnosis not present

## 2021-12-14 DIAGNOSIS — G893 Neoplasm related pain (acute) (chronic): Secondary | ICD-10-CM | POA: Diagnosis not present

## 2021-12-14 DIAGNOSIS — C649 Malignant neoplasm of unspecified kidney, except renal pelvis: Secondary | ICD-10-CM | POA: Diagnosis not present

## 2021-12-25 DIAGNOSIS — C7951 Secondary malignant neoplasm of bone: Secondary | ICD-10-CM | POA: Diagnosis not present

## 2021-12-25 DIAGNOSIS — C649 Malignant neoplasm of unspecified kidney, except renal pelvis: Secondary | ICD-10-CM | POA: Diagnosis not present

## 2022-01-07 DIAGNOSIS — Z515 Encounter for palliative care: Secondary | ICD-10-CM | POA: Diagnosis not present

## 2022-01-07 DIAGNOSIS — C649 Malignant neoplasm of unspecified kidney, except renal pelvis: Secondary | ICD-10-CM | POA: Diagnosis not present

## 2022-01-07 DIAGNOSIS — F418 Other specified anxiety disorders: Secondary | ICD-10-CM | POA: Diagnosis not present

## 2022-01-07 DIAGNOSIS — T402X5A Adverse effect of other opioids, initial encounter: Secondary | ICD-10-CM | POA: Diagnosis not present

## 2022-01-07 DIAGNOSIS — C7951 Secondary malignant neoplasm of bone: Secondary | ICD-10-CM | POA: Diagnosis not present

## 2022-01-07 DIAGNOSIS — G893 Neoplasm related pain (acute) (chronic): Secondary | ICD-10-CM | POA: Diagnosis not present

## 2022-01-07 DIAGNOSIS — R69 Illness, unspecified: Secondary | ICD-10-CM | POA: Diagnosis not present

## 2022-01-07 DIAGNOSIS — F32A Depression, unspecified: Secondary | ICD-10-CM | POA: Diagnosis not present

## 2022-01-07 DIAGNOSIS — K5903 Drug induced constipation: Secondary | ICD-10-CM | POA: Diagnosis not present

## 2022-01-28 DIAGNOSIS — F32A Depression, unspecified: Secondary | ICD-10-CM | POA: Diagnosis not present

## 2022-01-28 DIAGNOSIS — Z515 Encounter for palliative care: Secondary | ICD-10-CM | POA: Diagnosis not present

## 2022-01-28 DIAGNOSIS — R69 Illness, unspecified: Secondary | ICD-10-CM | POA: Diagnosis not present

## 2022-01-28 DIAGNOSIS — F418 Other specified anxiety disorders: Secondary | ICD-10-CM | POA: Diagnosis not present

## 2022-01-28 DIAGNOSIS — G893 Neoplasm related pain (acute) (chronic): Secondary | ICD-10-CM | POA: Diagnosis not present

## 2022-02-11 DIAGNOSIS — C7951 Secondary malignant neoplasm of bone: Secondary | ICD-10-CM | POA: Diagnosis not present

## 2022-02-11 DIAGNOSIS — L298 Other pruritus: Secondary | ICD-10-CM | POA: Diagnosis not present

## 2022-02-11 DIAGNOSIS — T50905A Adverse effect of unspecified drugs, medicaments and biological substances, initial encounter: Secondary | ICD-10-CM | POA: Diagnosis not present

## 2022-02-11 DIAGNOSIS — C649 Malignant neoplasm of unspecified kidney, except renal pelvis: Secondary | ICD-10-CM | POA: Diagnosis not present

## 2022-02-11 DIAGNOSIS — E069 Thyroiditis, unspecified: Secondary | ICD-10-CM | POA: Diagnosis not present

## 2022-02-18 DIAGNOSIS — C649 Malignant neoplasm of unspecified kidney, except renal pelvis: Secondary | ICD-10-CM | POA: Diagnosis not present

## 2022-02-18 DIAGNOSIS — F418 Other specified anxiety disorders: Secondary | ICD-10-CM | POA: Diagnosis not present

## 2022-02-18 DIAGNOSIS — R69 Illness, unspecified: Secondary | ICD-10-CM | POA: Diagnosis not present

## 2022-02-18 DIAGNOSIS — G893 Neoplasm related pain (acute) (chronic): Secondary | ICD-10-CM | POA: Diagnosis not present

## 2022-02-18 DIAGNOSIS — T402X5A Adverse effect of other opioids, initial encounter: Secondary | ICD-10-CM | POA: Diagnosis not present

## 2022-02-18 DIAGNOSIS — Z515 Encounter for palliative care: Secondary | ICD-10-CM | POA: Diagnosis not present

## 2022-02-18 DIAGNOSIS — C7951 Secondary malignant neoplasm of bone: Secondary | ICD-10-CM | POA: Diagnosis not present

## 2022-02-18 DIAGNOSIS — K5903 Drug induced constipation: Secondary | ICD-10-CM | POA: Diagnosis not present

## 2022-09-19 ENCOUNTER — Other Ambulatory Visit: Payer: Self-pay

## 2022-09-19 ENCOUNTER — Emergency Department: Payer: Medicare Other

## 2022-09-19 ENCOUNTER — Inpatient Hospital Stay
Admission: EM | Admit: 2022-09-19 | Discharge: 2022-09-20 | DRG: 563 | Discharge status: Against Medical Advice | Payer: Medicare Other | Attending: Internal Medicine | Admitting: Internal Medicine

## 2022-09-19 DIAGNOSIS — Z83719 Family history of colon polyps, unspecified: Secondary | ICD-10-CM

## 2022-09-19 DIAGNOSIS — C7931 Secondary malignant neoplasm of brain: Secondary | ICD-10-CM | POA: Diagnosis present

## 2022-09-19 DIAGNOSIS — Z96642 Presence of left artificial hip joint: Secondary | ICD-10-CM | POA: Diagnosis present

## 2022-09-19 DIAGNOSIS — Z853 Personal history of malignant neoplasm of breast: Secondary | ICD-10-CM | POA: Diagnosis not present

## 2022-09-19 DIAGNOSIS — Z91040 Latex allergy status: Secondary | ICD-10-CM

## 2022-09-19 DIAGNOSIS — Z818 Family history of other mental and behavioral disorders: Secondary | ICD-10-CM

## 2022-09-19 DIAGNOSIS — R9431 Abnormal electrocardiogram [ECG] [EKG]: Secondary | ICD-10-CM | POA: Diagnosis not present

## 2022-09-19 DIAGNOSIS — F419 Anxiety disorder, unspecified: Secondary | ICD-10-CM | POA: Diagnosis present

## 2022-09-19 DIAGNOSIS — F4310 Post-traumatic stress disorder, unspecified: Secondary | ICD-10-CM | POA: Diagnosis present

## 2022-09-19 DIAGNOSIS — Z888 Allergy status to other drugs, medicaments and biological substances status: Secondary | ICD-10-CM | POA: Diagnosis not present

## 2022-09-19 DIAGNOSIS — F17210 Nicotine dependence, cigarettes, uncomplicated: Secondary | ICD-10-CM | POA: Diagnosis present

## 2022-09-19 DIAGNOSIS — R7303 Prediabetes: Secondary | ICD-10-CM | POA: Diagnosis present

## 2022-09-19 DIAGNOSIS — C7951 Secondary malignant neoplasm of bone: Secondary | ICD-10-CM | POA: Diagnosis present

## 2022-09-19 DIAGNOSIS — R69 Illness, unspecified: Secondary | ICD-10-CM | POA: Diagnosis not present

## 2022-09-19 DIAGNOSIS — S82851A Displaced trimalleolar fracture of right lower leg, initial encounter for closed fracture: Secondary | ICD-10-CM | POA: Diagnosis not present

## 2022-09-19 DIAGNOSIS — G894 Chronic pain syndrome: Secondary | ICD-10-CM | POA: Diagnosis present

## 2022-09-19 DIAGNOSIS — C649 Malignant neoplasm of unspecified kidney, except renal pelvis: Secondary | ICD-10-CM | POA: Diagnosis present

## 2022-09-19 DIAGNOSIS — R600 Localized edema: Secondary | ICD-10-CM | POA: Diagnosis not present

## 2022-09-19 DIAGNOSIS — G319 Degenerative disease of nervous system, unspecified: Secondary | ICD-10-CM | POA: Diagnosis not present

## 2022-09-19 DIAGNOSIS — F332 Major depressive disorder, recurrent severe without psychotic features: Secondary | ICD-10-CM | POA: Diagnosis not present

## 2022-09-19 DIAGNOSIS — Z85038 Personal history of other malignant neoplasm of large intestine: Secondary | ICD-10-CM

## 2022-09-19 DIAGNOSIS — Z833 Family history of diabetes mellitus: Secondary | ICD-10-CM | POA: Diagnosis not present

## 2022-09-19 DIAGNOSIS — F32A Depression, unspecified: Secondary | ICD-10-CM | POA: Diagnosis present

## 2022-09-19 DIAGNOSIS — N183 Chronic kidney disease, stage 3 unspecified: Secondary | ICD-10-CM | POA: Diagnosis present

## 2022-09-19 DIAGNOSIS — Z66 Do not resuscitate: Secondary | ICD-10-CM | POA: Diagnosis present

## 2022-09-19 DIAGNOSIS — S82891A Other fracture of right lower leg, initial encounter for closed fracture: Secondary | ICD-10-CM | POA: Diagnosis not present

## 2022-09-19 DIAGNOSIS — S82843A Displaced bimalleolar fracture of unspecified lower leg, initial encounter for closed fracture: Secondary | ICD-10-CM | POA: Diagnosis present

## 2022-09-19 DIAGNOSIS — Z8249 Family history of ischemic heart disease and other diseases of the circulatory system: Secondary | ICD-10-CM | POA: Diagnosis not present

## 2022-09-19 DIAGNOSIS — Z885 Allergy status to narcotic agent status: Secondary | ICD-10-CM

## 2022-09-19 DIAGNOSIS — F1010 Alcohol abuse, uncomplicated: Secondary | ICD-10-CM | POA: Insufficient documentation

## 2022-09-19 DIAGNOSIS — Z789 Other specified health status: Secondary | ICD-10-CM | POA: Diagnosis present

## 2022-09-19 DIAGNOSIS — F1090 Alcohol use, unspecified, uncomplicated: Secondary | ICD-10-CM | POA: Diagnosis present

## 2022-09-19 DIAGNOSIS — S8251XA Displaced fracture of medial malleolus of right tibia, initial encounter for closed fracture: Secondary | ICD-10-CM | POA: Diagnosis not present

## 2022-09-19 DIAGNOSIS — K219 Gastro-esophageal reflux disease without esophagitis: Secondary | ICD-10-CM | POA: Diagnosis present

## 2022-09-19 DIAGNOSIS — I129 Hypertensive chronic kidney disease with stage 1 through stage 4 chronic kidney disease, or unspecified chronic kidney disease: Secondary | ICD-10-CM | POA: Diagnosis present

## 2022-09-19 DIAGNOSIS — M810 Age-related osteoporosis without current pathological fracture: Secondary | ICD-10-CM | POA: Diagnosis present

## 2022-09-19 DIAGNOSIS — Z79899 Other long term (current) drug therapy: Secondary | ICD-10-CM

## 2022-09-19 DIAGNOSIS — F17200 Nicotine dependence, unspecified, uncomplicated: Secondary | ICD-10-CM | POA: Diagnosis present

## 2022-09-19 DIAGNOSIS — S82841A Displaced bimalleolar fracture of right lower leg, initial encounter for closed fracture: Secondary | ICD-10-CM

## 2022-09-19 DIAGNOSIS — X501XXA Overexertion from prolonged static or awkward postures, initial encounter: Secondary | ICD-10-CM | POA: Diagnosis not present

## 2022-09-19 DIAGNOSIS — S0990XA Unspecified injury of head, initial encounter: Secondary | ICD-10-CM | POA: Diagnosis not present

## 2022-09-19 DIAGNOSIS — R402411 Glasgow coma scale score 13-15, in the field [EMT or ambulance]: Secondary | ICD-10-CM | POA: Diagnosis not present

## 2022-09-19 DIAGNOSIS — S82831A Other fracture of upper and lower end of right fibula, initial encounter for closed fracture: Secondary | ICD-10-CM | POA: Diagnosis not present

## 2022-09-19 DIAGNOSIS — Y92009 Unspecified place in unspecified non-institutional (private) residence as the place of occurrence of the external cause: Secondary | ICD-10-CM | POA: Diagnosis not present

## 2022-09-19 DIAGNOSIS — F3132 Bipolar disorder, current episode depressed, moderate: Secondary | ICD-10-CM | POA: Diagnosis present

## 2022-09-19 LAB — BASIC METABOLIC PANEL
Anion gap: 6 (ref 5–15)
BUN: 45 mg/dL — ABNORMAL HIGH (ref 8–23)
CO2: 35 mmol/L — ABNORMAL HIGH (ref 22–32)
Calcium: 9.2 mg/dL (ref 8.9–10.3)
Chloride: 97 mmol/L — ABNORMAL LOW (ref 98–111)
Creatinine, Ser: 1.19 mg/dL — ABNORMAL HIGH (ref 0.44–1.00)
GFR, Estimated: 52 mL/min — ABNORMAL LOW (ref >60)
Glucose, Bld: 126 mg/dL — ABNORMAL HIGH (ref 70–99)
Potassium: 4.7 mmol/L (ref 3.5–5.1)
Sodium: 138 mmol/L (ref 135–145)

## 2022-09-19 LAB — CBC WITH DIFFERENTIAL/PLATELET
Abs Immature Granulocytes: 0.94 10*3/uL — ABNORMAL HIGH (ref 0.00–0.07)
Basophils Absolute: 0.1 10*3/uL (ref 0.0–0.1)
Basophils Relative: 1 %
Eosinophils Absolute: 0 10*3/uL (ref 0.0–0.5)
Eosinophils Relative: 0 %
HCT: 39 % (ref 36.0–46.0)
Hemoglobin: 11.9 g/dL — ABNORMAL LOW (ref 12.0–15.0)
Immature Granulocytes: 6 %
Lymphocytes Relative: 8 %
Lymphs Abs: 1.2 10*3/uL (ref 0.7–4.0)
MCH: 30.9 pg (ref 26.0–34.0)
MCHC: 30.5 g/dL (ref 30.0–36.0)
MCV: 101.3 fL — ABNORMAL HIGH (ref 80.0–100.0)
Monocytes Absolute: 0.7 10*3/uL (ref 0.1–1.0)
Monocytes Relative: 5 %
Neutro Abs: 11.8 10*3/uL — ABNORMAL HIGH (ref 1.7–7.7)
Neutrophils Relative %: 80 %
Platelets: 201 10*3/uL (ref 150–400)
RBC: 3.85 MIL/uL — ABNORMAL LOW (ref 3.87–5.11)
RDW: 13.4 % (ref 11.5–15.5)
Smear Review: NORMAL
WBC: 14.7 10*3/uL — ABNORMAL HIGH (ref 4.0–10.5)
nRBC: 0 % (ref 0.0–0.2)

## 2022-09-19 MED ORDER — ONDANSETRON HCL 4 MG/2ML IJ SOLN
INTRAMUSCULAR | Status: AC
  Administered 2022-09-19: 4 mg
  Filled 2022-09-19: qty 2

## 2022-09-19 MED ORDER — KETAMINE HCL 50 MG/5ML IJ SOSY
0.2500 mg/kg | PREFILLED_SYRINGE | Freq: Once | INTRAMUSCULAR | Status: AC
  Administered 2022-09-19: 23 mg via INTRAVENOUS
  Filled 2022-09-19: qty 5

## 2022-09-19 MED ORDER — ENOXAPARIN SODIUM 60 MG/0.6ML IJ SOSY
0.5000 mg/kg | PREFILLED_SYRINGE | INTRAMUSCULAR | Status: DC
  Administered 2022-09-19: 45 mg via SUBCUTANEOUS
  Filled 2022-09-19: qty 0.6

## 2022-09-19 MED ORDER — LORAZEPAM 2 MG/ML IJ SOLN
0.5000 mg | Freq: Once | INTRAMUSCULAR | Status: AC
  Administered 2022-09-19: 0.5 mg via INTRAVENOUS
  Filled 2022-09-19: qty 1

## 2022-09-19 MED ORDER — LACTATED RINGERS IV SOLN: INTRAVENOUS | Status: DC

## 2022-09-19 MED ORDER — FENTANYL CITRATE PF 50 MCG/ML IJ SOSY
50.0000 ug | PREFILLED_SYRINGE | Freq: Once | INTRAMUSCULAR | Status: AC
  Administered 2022-09-19 (×2): 25 ug via INTRAVENOUS
  Filled 2022-09-19: qty 1

## 2022-09-19 MED ORDER — OXYCODONE HCL 5 MG PO TABS: 5.0000 mg | ORAL_TABLET | Freq: Four times a day (QID) | ORAL | 0 refills | Status: DC | PRN

## 2022-09-19 MED ORDER — SODIUM CHLORIDE 0.9 % IV BOLUS
1000.0000 mL | Freq: Once | INTRAVENOUS | Status: AC
  Administered 2022-09-19: 1000 mL via INTRAVENOUS

## 2022-09-19 MED ORDER — HYDROMORPHONE HCL 1 MG/ML IJ SOLN
0.5000 mg | INTRAMUSCULAR | Status: DC | PRN
  Filled 2022-09-19: qty 0.5

## 2022-09-19 MED ORDER — PROPOFOL 10 MG/ML IV BOLUS
0.2500 mg/kg | Freq: Once | INTRAVENOUS | Status: AC
  Administered 2022-09-19: 22.9 mg via INTRAVENOUS
  Filled 2022-09-19: qty 20

## 2022-09-19 MED ORDER — ONDANSETRON HCL 4 MG PO TABS: 4.0000 mg | ORAL_TABLET | Freq: Four times a day (QID) | ORAL | Status: DC | PRN

## 2022-09-19 MED ORDER — ONDANSETRON HCL 4 MG/2ML IJ SOLN
4.0000 mg | Freq: Four times a day (QID) | INTRAMUSCULAR | Status: DC | PRN
  Administered 2022-09-20: 4 mg via INTRAVENOUS
  Filled 2022-09-19 (×2): qty 2

## 2022-09-19 NOTE — ED Provider Notes (Addendum)
[facility] Provider Note    Event Date/Time   First MD Initiated Contact with Patient 09/19/22 1513     (approximate)   History   Ankle Pain   HPI  Kayla Chang is a 62 y.o. female   Past medical history of metastatic renal cell carcinoma with metastases to bone, no longer actively treated, depression, PTSD, osteoporosis who presents to the emergency department with ankle deformity and injury.  Right-sided injury sustained after standing up from couch and rolling the ankle.  No falls or head strike or other areas of injury.  EMS notes deformity to the right ankle which they reduced with traction and placed in a splint.  Independent Historian contributed to assessment above: ems, reporting injury and 175 mcg of fentanyl given IV en route.  External Medical Documents Reviewed: Hematology oncology note from June 2023 documenting her renal cell carcinoma therapy history      Physical Exam   Triage Vital Signs: ED Triage Vitals  Enc Vitals Group     BP 09/19/22 1519 (!) 142/73     Pulse Rate 09/19/22 1519 95     Resp 09/19/22 1519 18     Temp 09/19/22 1519 98.7 F (37.1 C)     Temp Source 09/19/22 1519 Oral     SpO2 09/19/22 1519 98 %     Weight 09/19/22 1524 201 lb 11.2 oz (91.5 kg)     Height 09/19/22 1524 '5\' 6"'$  (1.676 m)     Head Circumference --      Peak Flow --      Pain Score 09/19/22 1520 10     Pain Loc --      Pain Edu? --      Excl. in [place]? --     Most recent vital signs: Vitals:   09/19/22 1743 09/19/22 1748  BP: (!) 183/100 (!) 172/110  Pulse: 94 80  Resp: 18 18  Temp:    SpO2: 100% 100%    General: Awake, no distress.  CV:  Good peripheral perfusion.  Resp:  Normal effort.  Abd:  No distention.  Other:  Awake alert oriented with deformity to the right ankle, neurovascular intact, moving upper extremities and no chest, back, abdomen or other notable trauma on my exam, neck supple with full range of motion, no signs of  head trauma alert and oriented comfortable appearing.   ED Results / Procedures / Treatments   Labs (all labs ordered are listed, but only abnormal results are displayed) Labs Reviewed  BASIC METABOLIC PANEL - Abnormal; Notable for the following components:      Result Value   Chloride 97 (*)    CO2 35 (*)    Glucose, Bld 126 (*)    BUN 45 (*)    Creatinine, Ser 1.19 (*)    GFR, Estimated 52 (*)    All other components within normal limits  CBC WITH DIFFERENTIAL/PLATELET - Abnormal; Notable for the following components:   WBC 14.7 (*)    RBC 3.85 (*)    Hemoglobin 11.9 (*)    MCV 101.3 (*)    Neutro Abs 11.8 (*)    Abs Immature Granulocytes 0.94 (*)    All other components within normal limits     I ordered and reviewed the above labs they are notable for white blood cell count is elevated 14.7.  EKG  ED ECG REPORT I, Lucillie Garfinkel, the attending physician, personally viewed and interpreted this ECG.  Date: 09/19/2022  EKG Time: 1558  Rate: 55  Rhythm: nsr  Axis: nl  Intervals:none  ST&T Change: no Acute ischemic changes    RADIOLOGY I independently reviewed and interpreted x-ray of the ankle and see a fracture of the distal fibula and medial malleolus   PROCEDURES:  Critical Care performed: No  .Sedation  Date/Time: 09/19/2022 5:51 PM  Performed by: Lucillie Garfinkel, MD Authorized by: Lucillie Garfinkel, MD   Consent:    Consent obtained:  Verbal   Consent given by:  Patient and guardian   Risks discussed:  Allergic reaction, dysrhythmia, prolonged hypoxia resulting in organ damage, prolonged sedation necessitating reversal, respiratory compromise necessitating ventilatory assistance and intubation, inadequate sedation, nausea and vomiting   Alternatives discussed:  Analgesia without sedation and anxiolysis Universal protocol:    Procedure explained and questions answered to patient or proxy's satisfaction: yes     Relevant documents present and verified: yes      Imaging studies available: yes     Immediately prior to procedure, a time out was called: yes     Patient identity confirmed:  Verbally with patient and arm band Indications:    Procedure performed:  Fracture reduction   Procedure necessitating sedation performed by:  Physician performing sedation Pre-sedation assessment:    Time since last food or drink:  Morning   ASA classification: class 3 - patient with severe systemic disease     Mouth opening:  3 or more finger widths   Thyromental distance:  3 finger widths   Mallampati score:  I - soft palate, uvula, fauces, pillars visible   Neck mobility: normal     Pre-sedation assessments completed and reviewed: airway patency, cardiovascular function, mental status, pain level and respiratory function   Immediate pre-procedure details:    Reassessment: Patient reassessed immediately prior to procedure     Reviewed: vital signs, relevant labs/tests and NPO status     Verified: bag valve mask available, emergency equipment available, intubation equipment available, IV patency confirmed, oxygen available and suction available   Procedure details (see MAR for exact dosages):    Preoxygenation:  Nasal cannula   Sedation:  Ketamine and propofol   Intended level of sedation: deep   Analgesia:  Fentanyl   Intra-procedure monitoring:  Blood pressure monitoring, continuous capnometry, frequent LOC assessments, frequent vital sign checks, continuous pulse oximetry and cardiac monitor   Intra-procedure events: none     Total Provider sedation time (minutes):  20 Post-procedure details:    Attendance: Constant attendance by certified staff until patient recovered     Recovery: Patient returned to pre-procedure baseline     Post-sedation assessments completed and reviewed: airway patency, cardiovascular function, mental status, pain level and respiratory function     Patient is stable for discharge or admission: yes     Procedure completion:  Tolerated  well, no immediate complications     MEDICATIONS ORDERED IN ED: Medications  ketamine 50 mg in normal saline 5 mL (10 mg/mL) syringe (23 mg Intravenous Given 09/19/22 1741)  propofol (DIPRIVAN) 10 mg/mL bolus/IV push 22.9 mg (22.9 mg Intravenous Given 09/19/22 1742)  sodium chloride 0.9 % bolus 1,000 mL (1,000 mLs Intravenous New Bag/Given 09/19/22 1737)  fentaNYL (SUBLIMAZE) injection 50 mcg (25 mcg Intravenous Given 09/19/22 1735)  ondansetron (ZOFRAN) 4 MG/2ML injection (4 mg  Given 09/19/22 1740)    External physician / consultants:  I spoke with orthopedic consultant regarding care plan for this patient.   IMPRESSION / MDM / ASSESSMENT AND  PLAN / ED COURSE  I reviewed the triage vital signs and the nursing notes.                                Patient's presentation is most consistent with acute presentation with potential threat to life or bodily function.  Differential diagnosis includes, but is not limited to, ankle fracture, neurovascular injury, intracranial bleeding other blunt traumatic injury    MDM: With osteoporosis and bony metastases from her cancer with a mechanical injury to the right ankle with deformity neurovascular intact and x-ray shows fracture both to the distal fibula and medial malleolus, pending CT head to rule out intracranial bleeding, preop labs ordered in case operative management is needed, pain well-controlled with IV fentanyl patient is otherwise stable without any other medical complaints.  I consulted with orthopedics Dr.Aberman for further evaluation and management planning - will further discuss this injury type w podiatrist .    Sedation and reduction performed splinted.  CT scan ordered.  Given the patient's altered mental status at baseline with brain metastases daughter states that she will be very difficult to adhere to nonweightbearing status at home, will admit to the hospital service for PT evaluation.      FINAL CLINICAL IMPRESSION(S) /  ED DIAGNOSES   Final diagnoses:  Closed fracture of right ankle, initial encounter     Rx / DC Orders   ED Discharge Orders          Ordered    oxyCODONE (ROXICODONE) 5 MG immediate release tablet  Every 6 hours PRN        09/19/22 1621             Note:  This document was prepared using Dragon voice recognition software and may include unintentional dictation errors.    Lucillie Garfinkel, MD 09/19/22 1609    Lucillie Garfinkel, MD 09/19/22 1623    Lucillie Garfinkel, MD 09/19/22 1754
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
## 2022-09-19 NOTE — ED Notes (Signed)
Pt sleeping in bed, in nad, daughter at bedside, resp even and unlab

## 2022-09-19 NOTE — ED Notes (Signed)
Pt confused and pulling at all tubes and wires, md notified, ativan ordered.

## 2022-09-19 NOTE — Progress Notes (Signed)
Anticoagulation monitoring(Lovenox):  62 yo  female ordered Lovenox 40 mg Q24h    Filed Weights   09/19/22 1524  Weight: 91.5 kg (201 lb 11.2 oz)   BMI 32.6   Lab Results  Component Value Date   CREATININE 1.19 (H) 09/19/2022   CREATININE 0.74 11/02/2018   CREATININE 0.80 10/26/2018   Estimated Creatinine Clearance: 56.6 mL/min (A) (by C-G formula based on SCr of 1.19 mg/dL (H)). Hemoglobin & Hematocrit     Component Value Date/Time   HGB 11.9 (L) 09/19/2022 1538   HCT 39.0 09/19/2022 1538     Per Protocol for Patient with estCrcl > 30 ml/min and BMI > 30, will transition to Lovenox 45 mg Q24h.

## 2022-09-19 NOTE — Discharge Instructions (Addendum)
Call Dr Sherryle Lis for an appointment to discuss surgery  Take acetaminophen 650 mg and ibuprofen 400 mg every 6 hours for pain.  Take with food. Take oxycodone as prescribed for breakthrough or severe pain.  Keep in splint and use crutches.    Thank you for choosing [place] for your health care today!  Please see your primary doctor this week for a follow up appointment.   Sometimes, in the early stages of certain disease courses it is difficult to detect in the emergency department evaluation -- so, it is important that you continue to monitor your symptoms and call your doctor right away or return to the emergency department if you develop any new or worsening symptoms.  Please go to the following website to schedule new (and existing) patient appointments:   [email or web address]  If you do not have a primary doctor try calling the following clinics to establish care:  If you have insurance:  [facility] - [phone] [place] [address]   [facility]  [phone] [place] [place] [address]   If you do not have insurance:  [facility]  [phone] [address], [place] [address]   The following is another list of primary care offices in the area who are accepting new patients at this time.  Please reach out to one of them directly and let them know you would like to schedule an appointment to follow up on an Emergency Department visit, and/or to establish a new primary care provider (PCP).  There are likely other primary care clinics in the are who are accepting new patients, but this is an excellent place to start:  [place] physician: Dr Lavon Paganini [address] [place], [place] [address] [phone]  [facility] Lead Physician: Dr Steele Sizer [address], [place], [place] [address] [phone]  [place] Physician: Dr Park Liter [address] [place], [place] [address] [phone]  [facility] Lead Physician: Dr Dewaine Oats [place], [place], [place] [address] [phone]  [place] Physician: Dr Halina Maidens [address] [place], [place] [address] [phone]   It was my pleasure to care for you today.   [place] Brunette Jacelyn Grip, MD

## 2022-09-19 NOTE — ED Triage Notes (Signed)
Patient stood up and rolled right ankle; per EMS positive deformity.

## 2022-09-20 MED ORDER — METHADONE HCL 10 MG PO TABS
40.0000 mg | ORAL_TABLET | Freq: Once | ORAL | Status: AC
  Administered 2022-09-20: 40 mg via ORAL
  Filled 2022-09-20: qty 4

## 2022-09-20 MED ORDER — METHADONE HCL 10 MG PO TABS: 10.0000 mg | ORAL_TABLET | Freq: Once | ORAL | Status: DC

## 2022-09-20 NOTE — ED Notes (Addendum)
O2 tank switched out; pt remains on 2L via [place]. Family remains at bedside. Rate of drip adjusted/decreased for LR currently running as no IV pumps/channels currently available but nearly half of bag has already run since being hung by previous RN at 2258.

## 2022-09-20 NOTE — Progress Notes (Signed)
        Against Medical Advice  NAME: Kayla Chang MRN: [id number] DOB : 04/22/1961 ATTENDING PHYSICIAN: Elwyn Reach, MD                                                                                                  Patient and family at this time expresses desire to leave the Hospital immediately, patient has been warned that this is not Medically advisable at this time, and can result in Medical complications like Death and Disability.   Patient and family have also been advised that if they feel the need for further medical assistance to return to the closest ER or dial [phone].   To reach the provider On-Call:   7AM- 7PM see care teams to locate the attending and reach out to them via [email or web address] Password: [email or web address] 7PM-7AM contact night-coverage If you still have difficulty reaching the appropriate provider, please page the [facility] (Director on Call) for [facility] on amion for assistance  This document was prepared using Systems analyst and may include unintentional dictation errors.  Neomia Glass DNP, MBA, FNP-BC Nurse Practitioner [facility] Pager [phone]

## 2022-09-20 NOTE — ED Notes (Signed)
Pt awake. Pt denies nausea, pain and dizziness currently.

## 2022-09-20 NOTE — ED Notes (Signed)
Daughter, POA to desk asking POC for pt, advised per note pt will be evaluated by PT and pain control, daughter states pt is a home Hospice pt with stage 4 cancer to bone, brain, colon, kidneys and breast, pt and POA state they would like to be discharged home for comfort care, daughter states pt has had a significant cognitive decline over the past week and says even if surgery were an option patient would not want it, she further reports pt has pain meds at home, Hospice nurse comes out 3 times per week, aide is available and a hospital bed is being delivered today; hospital coverage made aware with reply back that pt is unable to be discharged tonight and if she desires to go home she will need to sign out AMA, information relayed to daughter and discussed benefits of staying and risks of leaving AMA, daughter reports she would like to leave with pt AMA, pt agrees she would like to leave AMA

## 2022-09-20 NOTE — ED Notes (Addendum)
Pt denies being wet; checked pt's briefs when placing purewick and pt indeed dry; pt requested to have purewick set up again; purewick set up and now in use.

## 2022-09-20 NOTE — ED Notes (Signed)
This RN assisted pt to w/c and into private vehicle for transport home; pt able to stand and pivot; daughter reports she has a w/c at home and plans to call non-emergency number that she found online to get assistance in getting pt inside

## 2022-09-20 NOTE — Progress Notes (Addendum)
CROSS COVER NOTE  NAME: Kayla Chang MRN: [id number] DOB : [birth year] ATTENDING PHYSICIAN: Elwyn Reach, MD    Date of Service   09/20/2022   HPI/Events of Note   Medication request received for home Methadone.  Interventions   Assessment/Plan:  Methadone 40 mg PO x1      To reach the provider On-Call:   7AM- 7PM see care teams to locate the attending and reach out to them via [email or web address] Password: [email or web address] 7PM-7AM contact night-coverage If you still have difficulty reaching the appropriate provider, please page the [facility] (Director on Call) for [facility] on amion for assistance  This document was prepared using Systems analyst and may include unintentional dictation errors.  Neomia Glass DNP, MBA, FNP-BC Nurse Practitioner [facility] Pager [phone]

## 2022-09-30 DEATH — deceased
